# Patient Record
Sex: Female | Born: 1950
Health system: Southern US, Community
[De-identification: ages and names within clinical notes are randomized; demographics above are authoritative.]

## PROBLEM LIST (undated history)

## (undated) DIAGNOSIS — E119 Type 2 diabetes mellitus without complications: Secondary | ICD-10-CM

## (undated) DIAGNOSIS — E079 Disorder of thyroid, unspecified: Secondary | ICD-10-CM

## (undated) DIAGNOSIS — E785 Hyperlipidemia, unspecified: Secondary | ICD-10-CM

## (undated) DIAGNOSIS — I10 Essential (primary) hypertension: Secondary | ICD-10-CM

## (undated) DIAGNOSIS — C801 Malignant (primary) neoplasm, unspecified: Secondary | ICD-10-CM

## (undated) HISTORY — PX: WISDOM TOOTH EXTRACTION: SHX21

## (undated) HISTORY — PX: DILATION AND CURETTAGE OF UTERUS: SHX78

---

## 2006-08-02 ENCOUNTER — Ambulatory Visit: Payer: Self-pay

## 2007-10-03 ENCOUNTER — Ambulatory Visit: Payer: Self-pay

## 2008-10-03 ENCOUNTER — Ambulatory Visit: Payer: Self-pay | Admitting: Family Medicine

## 2009-10-06 ENCOUNTER — Ambulatory Visit: Payer: Self-pay

## 2010-10-08 ENCOUNTER — Ambulatory Visit: Payer: Self-pay

## 2011-12-27 ENCOUNTER — Ambulatory Visit: Payer: Self-pay | Admitting: Family Medicine

## 2013-01-09 ENCOUNTER — Ambulatory Visit: Payer: Self-pay | Admitting: Family Medicine

## 2014-02-12 DIAGNOSIS — I10 Essential (primary) hypertension: Secondary | ICD-10-CM | POA: Insufficient documentation

## 2014-02-12 DIAGNOSIS — E039 Hypothyroidism, unspecified: Secondary | ICD-10-CM | POA: Insufficient documentation

## 2014-02-12 DIAGNOSIS — I152 Hypertension secondary to endocrine disorders: Secondary | ICD-10-CM | POA: Insufficient documentation

## 2014-02-12 DIAGNOSIS — E1159 Type 2 diabetes mellitus with other circulatory complications: Secondary | ICD-10-CM | POA: Insufficient documentation

## 2014-03-10 ENCOUNTER — Ambulatory Visit: Payer: Self-pay | Admitting: Internal Medicine

## 2015-03-05 ENCOUNTER — Other Ambulatory Visit: Payer: Self-pay | Admitting: Internal Medicine

## 2015-03-05 DIAGNOSIS — Z1231 Encounter for screening mammogram for malignant neoplasm of breast: Secondary | ICD-10-CM

## 2015-03-12 ENCOUNTER — Ambulatory Visit
Admission: RE | Admit: 2015-03-12 | Discharge: 2015-03-12 | Disposition: A | Discharge status: Home / Self Care | Payer: No Typology Code available for payment source | Source: Ambulatory Visit | Attending: Internal Medicine | Admitting: Internal Medicine

## 2015-03-12 DIAGNOSIS — Z1231 Encounter for screening mammogram for malignant neoplasm of breast: Secondary | ICD-10-CM | POA: Insufficient documentation

## 2015-03-12 HISTORY — DX: Malignant (primary) neoplasm, unspecified: C80.1

## 2015-09-04 DIAGNOSIS — E119 Type 2 diabetes mellitus without complications: Secondary | ICD-10-CM | POA: Insufficient documentation

## 2015-12-04 ENCOUNTER — Other Ambulatory Visit: Payer: Self-pay | Admitting: Internal Medicine

## 2015-12-04 DIAGNOSIS — E782 Mixed hyperlipidemia: Secondary | ICD-10-CM | POA: Insufficient documentation

## 2015-12-04 DIAGNOSIS — E1169 Type 2 diabetes mellitus with other specified complication: Secondary | ICD-10-CM | POA: Insufficient documentation

## 2015-12-04 DIAGNOSIS — Z1239 Encounter for other screening for malignant neoplasm of breast: Secondary | ICD-10-CM

## 2016-03-14 ENCOUNTER — Other Ambulatory Visit: Payer: Self-pay | Admitting: Internal Medicine

## 2016-03-14 ENCOUNTER — Ambulatory Visit
Admission: RE | Admit: 2016-03-14 | Discharge: 2016-03-14 | Disposition: A | Discharge status: Home / Self Care | Payer: BLUE CROSS/BLUE SHIELD | Source: Ambulatory Visit | Attending: Internal Medicine | Admitting: Internal Medicine

## 2016-03-14 DIAGNOSIS — Z1239 Encounter for other screening for malignant neoplasm of breast: Secondary | ICD-10-CM

## 2016-03-14 DIAGNOSIS — Z1231 Encounter for screening mammogram for malignant neoplasm of breast: Secondary | ICD-10-CM | POA: Diagnosis not present

## 2016-10-27 DIAGNOSIS — E119 Type 2 diabetes mellitus without complications: Secondary | ICD-10-CM | POA: Diagnosis not present

## 2017-03-02 DIAGNOSIS — E119 Type 2 diabetes mellitus without complications: Secondary | ICD-10-CM | POA: Diagnosis not present

## 2017-03-02 DIAGNOSIS — Z0000 Encounter for general adult medical examination without abnormal findings: Secondary | ICD-10-CM | POA: Diagnosis not present

## 2017-03-02 DIAGNOSIS — I10 Essential (primary) hypertension: Secondary | ICD-10-CM | POA: Diagnosis not present

## 2017-03-02 DIAGNOSIS — E785 Hyperlipidemia, unspecified: Secondary | ICD-10-CM | POA: Diagnosis not present

## 2017-03-09 DIAGNOSIS — Z1231 Encounter for screening mammogram for malignant neoplasm of breast: Secondary | ICD-10-CM | POA: Diagnosis not present

## 2017-03-09 DIAGNOSIS — Z0000 Encounter for general adult medical examination without abnormal findings: Secondary | ICD-10-CM | POA: Diagnosis not present

## 2017-03-09 DIAGNOSIS — Z780 Asymptomatic menopausal state: Secondary | ICD-10-CM | POA: Diagnosis not present

## 2017-03-09 DIAGNOSIS — Z23 Encounter for immunization: Secondary | ICD-10-CM | POA: Diagnosis not present

## 2017-03-09 DIAGNOSIS — I10 Essential (primary) hypertension: Secondary | ICD-10-CM | POA: Diagnosis not present

## 2017-03-09 DIAGNOSIS — Z1211 Encounter for screening for malignant neoplasm of colon: Secondary | ICD-10-CM | POA: Diagnosis not present

## 2017-03-10 ENCOUNTER — Other Ambulatory Visit: Payer: Self-pay | Admitting: Internal Medicine

## 2017-03-10 DIAGNOSIS — Z1231 Encounter for screening mammogram for malignant neoplasm of breast: Secondary | ICD-10-CM

## 2017-03-15 DIAGNOSIS — Z0000 Encounter for general adult medical examination without abnormal findings: Secondary | ICD-10-CM | POA: Diagnosis not present

## 2017-03-15 DIAGNOSIS — I10 Essential (primary) hypertension: Secondary | ICD-10-CM | POA: Diagnosis not present

## 2017-03-15 DIAGNOSIS — Z1231 Encounter for screening mammogram for malignant neoplasm of breast: Secondary | ICD-10-CM | POA: Diagnosis not present

## 2017-03-15 DIAGNOSIS — Z1211 Encounter for screening for malignant neoplasm of colon: Secondary | ICD-10-CM | POA: Diagnosis not present

## 2017-03-15 DIAGNOSIS — Z780 Asymptomatic menopausal state: Secondary | ICD-10-CM | POA: Diagnosis not present

## 2017-03-15 DIAGNOSIS — Z23 Encounter for immunization: Secondary | ICD-10-CM | POA: Diagnosis not present

## 2017-03-23 ENCOUNTER — Ambulatory Visit
Admission: RE | Admit: 2017-03-23 | Discharge: 2017-03-23 | Disposition: A | Discharge status: Home / Self Care | Payer: PPO | Source: Ambulatory Visit | Attending: Internal Medicine | Admitting: Internal Medicine

## 2017-03-23 DIAGNOSIS — Z1231 Encounter for screening mammogram for malignant neoplasm of breast: Secondary | ICD-10-CM | POA: Diagnosis not present

## 2017-06-14 DIAGNOSIS — I10 Essential (primary) hypertension: Secondary | ICD-10-CM | POA: Diagnosis not present

## 2017-06-14 DIAGNOSIS — Z780 Asymptomatic menopausal state: Secondary | ICD-10-CM | POA: Diagnosis not present

## 2017-06-21 DIAGNOSIS — E782 Mixed hyperlipidemia: Secondary | ICD-10-CM | POA: Diagnosis not present

## 2017-06-21 DIAGNOSIS — Z780 Asymptomatic menopausal state: Secondary | ICD-10-CM | POA: Diagnosis not present

## 2017-06-21 DIAGNOSIS — I10 Essential (primary) hypertension: Secondary | ICD-10-CM | POA: Diagnosis not present

## 2017-06-21 DIAGNOSIS — E119 Type 2 diabetes mellitus without complications: Secondary | ICD-10-CM | POA: Diagnosis not present

## 2017-06-21 DIAGNOSIS — E039 Hypothyroidism, unspecified: Secondary | ICD-10-CM | POA: Diagnosis not present

## 2018-03-05 DIAGNOSIS — E119 Type 2 diabetes mellitus without complications: Secondary | ICD-10-CM | POA: Diagnosis not present

## 2018-03-05 DIAGNOSIS — I10 Essential (primary) hypertension: Secondary | ICD-10-CM | POA: Diagnosis not present

## 2018-03-05 DIAGNOSIS — E039 Hypothyroidism, unspecified: Secondary | ICD-10-CM | POA: Diagnosis not present

## 2018-03-05 DIAGNOSIS — E782 Mixed hyperlipidemia: Secondary | ICD-10-CM | POA: Diagnosis not present

## 2018-03-12 DIAGNOSIS — Z23 Encounter for immunization: Secondary | ICD-10-CM | POA: Diagnosis not present

## 2018-03-12 DIAGNOSIS — E119 Type 2 diabetes mellitus without complications: Secondary | ICD-10-CM | POA: Diagnosis not present

## 2018-03-12 DIAGNOSIS — Z0000 Encounter for general adult medical examination without abnormal findings: Secondary | ICD-10-CM | POA: Diagnosis not present

## 2018-03-12 DIAGNOSIS — E039 Hypothyroidism, unspecified: Secondary | ICD-10-CM | POA: Diagnosis not present

## 2018-03-26 DIAGNOSIS — Z800 Family history of malignant neoplasm of digestive organs: Secondary | ICD-10-CM | POA: Diagnosis not present

## 2018-03-26 DIAGNOSIS — Z1211 Encounter for screening for malignant neoplasm of colon: Secondary | ICD-10-CM | POA: Diagnosis not present

## 2018-03-26 DIAGNOSIS — Z8371 Family history of colonic polyps: Secondary | ICD-10-CM | POA: Diagnosis not present

## 2018-03-27 ENCOUNTER — Other Ambulatory Visit: Payer: Self-pay | Admitting: Internal Medicine

## 2018-03-27 DIAGNOSIS — Z1231 Encounter for screening mammogram for malignant neoplasm of breast: Secondary | ICD-10-CM

## 2018-04-05 ENCOUNTER — Ambulatory Visit
Admission: RE | Admit: 2018-04-05 | Discharge: 2018-04-05 | Disposition: A | Discharge status: Home / Self Care | Payer: PPO | Source: Ambulatory Visit | Attending: Internal Medicine | Admitting: Internal Medicine

## 2018-04-05 DIAGNOSIS — Z1231 Encounter for screening mammogram for malignant neoplasm of breast: Secondary | ICD-10-CM | POA: Insufficient documentation

## 2018-05-29 ENCOUNTER — Encounter: Payer: Self-pay | Admitting: *Deleted

## 2018-05-30 ENCOUNTER — Ambulatory Visit
Admission: RE | Admit: 2018-05-30 | Discharge: 2018-05-30 | Disposition: A | Discharge status: Home / Self Care | Payer: PPO | Source: Ambulatory Visit | Attending: Unknown Physician Specialty | Admitting: Unknown Physician Specialty

## 2018-05-30 ENCOUNTER — Ambulatory Visit: Payer: PPO | Admitting: Certified Registered Nurse Anesthetist

## 2018-05-30 ENCOUNTER — Encounter
Admission: RE | Disposition: A | Discharge status: Home / Self Care | Payer: Self-pay | Source: Ambulatory Visit | Attending: Unknown Physician Specialty

## 2018-05-30 ENCOUNTER — Encounter: Payer: Self-pay | Admitting: Anesthesiology

## 2018-05-30 DIAGNOSIS — E039 Hypothyroidism, unspecified: Secondary | ICD-10-CM | POA: Diagnosis not present

## 2018-05-30 DIAGNOSIS — K5730 Diverticulosis of large intestine without perforation or abscess without bleeding: Secondary | ICD-10-CM | POA: Diagnosis not present

## 2018-05-30 DIAGNOSIS — E119 Type 2 diabetes mellitus without complications: Secondary | ICD-10-CM | POA: Diagnosis not present

## 2018-05-30 DIAGNOSIS — K640 First degree hemorrhoids: Secondary | ICD-10-CM | POA: Diagnosis not present

## 2018-05-30 DIAGNOSIS — Z7984 Long term (current) use of oral hypoglycemic drugs: Secondary | ICD-10-CM | POA: Insufficient documentation

## 2018-05-30 DIAGNOSIS — Z800 Family history of malignant neoplasm of digestive organs: Secondary | ICD-10-CM | POA: Insufficient documentation

## 2018-05-30 DIAGNOSIS — Z79899 Other long term (current) drug therapy: Secondary | ICD-10-CM | POA: Insufficient documentation

## 2018-05-30 DIAGNOSIS — Z1211 Encounter for screening for malignant neoplasm of colon: Secondary | ICD-10-CM | POA: Diagnosis not present

## 2018-05-30 DIAGNOSIS — E785 Hyperlipidemia, unspecified: Secondary | ICD-10-CM | POA: Diagnosis not present

## 2018-05-30 DIAGNOSIS — Z85828 Personal history of other malignant neoplasm of skin: Secondary | ICD-10-CM | POA: Diagnosis not present

## 2018-05-30 DIAGNOSIS — I10 Essential (primary) hypertension: Secondary | ICD-10-CM | POA: Insufficient documentation

## 2018-05-30 DIAGNOSIS — Z79890 Hormone replacement therapy (postmenopausal): Secondary | ICD-10-CM | POA: Diagnosis not present

## 2018-05-30 DIAGNOSIS — K648 Other hemorrhoids: Secondary | ICD-10-CM | POA: Diagnosis not present

## 2018-05-30 DIAGNOSIS — K5790 Diverticulosis of intestine, part unspecified, without perforation or abscess without bleeding: Secondary | ICD-10-CM | POA: Diagnosis not present

## 2018-05-30 HISTORY — DX: Hyperlipidemia, unspecified: E78.5

## 2018-05-30 HISTORY — DX: Type 2 diabetes mellitus without complications: E11.9

## 2018-05-30 HISTORY — PX: COLONOSCOPY WITH PROPOFOL: SHX5780

## 2018-05-30 HISTORY — DX: Disorder of thyroid, unspecified: E07.9

## 2018-05-30 HISTORY — DX: Essential (primary) hypertension: I10

## 2018-05-30 LAB — GLUCOSE, CAPILLARY: GLUCOSE-CAPILLARY: 85 mg/dL (ref 70–99)

## 2018-05-30 SURGERY — COLONOSCOPY WITH PROPOFOL
Anesthesia: General

## 2018-05-30 MED ORDER — GLYCOPYRROLATE 0.2 MG/ML IJ SOLN
INTRAMUSCULAR | Status: DC | PRN
  Administered 2018-05-30: 0.2 mg via INTRAVENOUS

## 2018-05-30 MED ORDER — SODIUM CHLORIDE 0.9 % IV SOLN
INTRAVENOUS | Status: DC
  Administered 2018-05-30: 10000 mL via INTRAVENOUS

## 2018-05-30 MED ORDER — MIDAZOLAM HCL 5 MG/5ML IJ SOLN
INTRAMUSCULAR | Status: DC | PRN
  Administered 2018-05-30: 1 mg via INTRAVENOUS

## 2018-05-30 MED ORDER — LIDOCAINE HCL (PF) 1 % IJ SOLN
INTRAMUSCULAR | Status: AC
  Administered 2018-05-30: 0.3 mL via INTRADERMAL
  Filled 2018-05-30: qty 2

## 2018-05-30 MED ORDER — PROPOFOL 10 MG/ML IV BOLUS
INTRAVENOUS | Status: DC | PRN
  Administered 2018-05-30: 80 mg via INTRAVENOUS

## 2018-05-30 MED ORDER — SODIUM CHLORIDE 0.9 % IV SOLN: INTRAVENOUS | Status: DC

## 2018-05-30 MED ORDER — FENTANYL CITRATE (PF) 100 MCG/2ML IJ SOLN
INTRAMUSCULAR | Status: DC | PRN
  Administered 2018-05-30: 50 ug via INTRAVENOUS

## 2018-05-30 MED ORDER — LIDOCAINE HCL (CARDIAC) PF 100 MG/5ML IV SOSY
PREFILLED_SYRINGE | INTRAVENOUS | Status: DC | PRN
  Administered 2018-05-30: 100 mg via INTRAVENOUS

## 2018-05-30 MED ORDER — PROPOFOL 500 MG/50ML IV EMUL
INTRAVENOUS | Status: DC | PRN
  Administered 2018-05-30: 100 ug/kg/min via INTRAVENOUS

## 2018-05-30 MED ORDER — LIDOCAINE HCL (PF) 1 % IJ SOLN
2.0000 mL | Freq: Once | INTRAMUSCULAR | Status: AC
  Administered 2018-05-30: 0.3 mL via INTRADERMAL

## 2018-05-30 NOTE — Anesthesia Post-op Follow-up Note (Signed)
Anesthesia QCDR form completed.        

## 2018-05-30 NOTE — Op Note (Signed)
[facility] Patient Name: Andrea Browning Procedure Date: 05/30/2018 8:21 AM MRN: [id number] Account #: [id number] Date of Birth: Dec 13, 1950 Admit Type: Outpatient Age: 67 Room: [facility] ENDO ROOM 2 Gender: Female Note Status: Finalized Procedure:            Colonoscopy Indications:          Screening in patient at increased risk: Family history                        of 1st-degree relative with colorectal cancer before                        age 43 years Providers:            Manya Silvas, MD Referring MD:         Glendon Axe (Referring MD) Medicines:            Propofol per Anesthesia Complications:        No immediate complications. Procedure:            Pre-Anesthesia Assessment:                       - After reviewing the risks and benefits, the patient                        was deemed in satisfactory condition to undergo the                        procedure.                       After obtaining informed consent, the colonoscope was                        passed under direct vision. Throughout the procedure,                        the patient's blood pressure, pulse, and oxygen                        saturations were monitored continuously. The                        Colonoscope was introduced through the anus and                        advanced to the the cecum, identified by appendiceal                        orifice and ileocecal valve. The colonoscopy was                        somewhat difficult due to poor bowel prep. Successful                        completion of the procedure was aided by lavage. The                        patient tolerated the procedure well. Findings:      Multiple small-medium-mouthed diverticula were found in the sigmoid  colon, descending colon, transverse colon and ascending colon.      Internal hemorrhoids were found during endoscopy. The hemorrhoids were       small and Grade I (internal hemorrhoids  that do not prolapse).      The exam was otherwise without abnormality. Impression:           - Diverticulosis in the sigmoid colon, in the                        descending colon, in the transverse colon and in the                        ascending colon.                       - Internal hemorrhoids.                       - The examination was otherwise normal.                       - No specimens collected. Recommendation:       Repeat colon exam in 5 years.                       - Repeat colonoscopy in 5 years for screening purposes. Manya Silvas, MD 05/30/2018 9:08:02 AM This report has been signed electronically. Number of Addenda: 0 Note Initiated On: 05/30/2018 8:21 AM Scope Withdrawal Time: 0 hours 12 minutes 46 seconds  Total Procedure Duration: 0 hours 23 minutes 44 seconds       [facility]

## 2018-05-30 NOTE — Anesthesia Postprocedure Evaluation (Signed)
Anesthesia Post Note  Patient: Andrea Browning  Procedure(s) Performed: COLONOSCOPY WITH PROPOFOL (N/A )  Patient location during evaluation: Endoscopy Anesthesia Type: General Level of consciousness: awake and alert Pain management: pain level controlled Vital Signs Assessment: post-procedure vital signs reviewed and stable Respiratory status: spontaneous breathing, nonlabored ventilation, respiratory function stable and patient connected to nasal cannula oxygen Cardiovascular status: blood pressure returned to baseline and stable Postop Assessment: no apparent nausea or vomiting Anesthetic complications: no     Last Vitals:  Vitals:   05/30/18 0900 05/30/18 0910  BP: (!) 107/55 112/68  Pulse: (!) 57 63  Resp: 12 12  Temp: (!) 36 C   SpO2: 100% 99%    Last Pain:  Vitals:   05/30/18 0900  TempSrc: Tympanic                 Martha Clan

## 2018-05-30 NOTE — Anesthesia Preprocedure Evaluation (Signed)
Anesthesia Evaluation  Patient identified by MRN, date of birth, ID band Patient awake    Reviewed: Allergy & Precautions, H&P , NPO status , Patient's Chart, lab work & pertinent test results, reviewed documented beta blocker date and time   History of Anesthesia Complications Negative for: history of anesthetic complications  Airway Mallampati: I  TM Distance: >3 FB Neck ROM: full    Dental  (+) Dental Advidsory Given, Missing, Teeth Intact   Pulmonary neg pulmonary ROS,           Cardiovascular Exercise Tolerance: Good hypertension, (-) angina(-) CAD, (-) Past MI, (-) Cardiac Stents and (-) CABG (-) dysrhythmias (-) Valvular Problems/Murmurs     Neuro/Psych negative neurological ROS  negative psych ROS   GI/Hepatic negative GI ROS, Neg liver ROS,   Endo/Other  diabetesHypothyroidism   Renal/GU negative Renal ROS  negative genitourinary   Musculoskeletal   Abdominal   Peds  Hematology negative hematology ROS (+)   Anesthesia Other Findings Past Medical History: No date: Cancer ([place])     Comment:  Skin No date: Diabetes mellitus without complication (HCC) No date: Hyperlipidemia No date: Hypertension No date: Thyroid disease   Reproductive/Obstetrics negative OB ROS                             Anesthesia Physical Anesthesia Plan  ASA: II  Anesthesia Plan: General   Post-op Pain Management:    Induction: Intravenous  PONV Risk Score and Plan: 3 and Propofol infusion and TIVA  Airway Management Planned: Natural Airway and Nasal Cannula  Additional Equipment:   Intra-op Plan:   Post-operative Plan:   Informed Consent: I have reviewed the patients History and Physical, chart, labs and discussed the procedure including the risks, benefits and alternatives for the proposed anesthesia with the patient or authorized representative who has indicated his/her understanding and  acceptance.   Dental Advisory Given  Plan Discussed with: Anesthesiologist, CRNA and Surgeon  Anesthesia Plan Comments:         Anesthesia Quick Evaluation

## 2018-05-30 NOTE — Transfer of Care (Signed)
Immediate Anesthesia Transfer of Care Note  Patient: Andrea Browning  Procedure(s) Performed: COLONOSCOPY WITH PROPOFOL (N/A )  Patient Location: PACU  Anesthesia Type:General  Level of Consciousness: sedated  Airway & Oxygen Therapy: Patient Spontanous Breathing and Patient connected to nasal cannula oxygen  Post-op Assessment: Report given to RN and Post -op Vital signs reviewed and stable  Post vital signs: Reviewed and stable  Last Vitals:  Vitals Value Taken Time  BP    Temp    Pulse    Resp    SpO2      Last Pain:  Vitals:   05/30/18 0756  TempSrc: Tympanic         Complications: No apparent anesthesia complications

## 2018-05-30 NOTE — H&P (Signed)
Primary Care Physician:  Glendon Axe, MD Primary Gastroenterologist:  Dr. Vira Agar  Pre-Procedure History & Physical: HPI:  Andrea Browning is a 67 y.o. female is here for an colonoscopy.  This is her first colonoscopy, her mother died of colon cancer at age 88.   Past Medical History:  Diagnosis Date  . Cancer (HCC)    Skin  . Diabetes mellitus without complication ([place])   . Hyperlipidemia   . Hypertension   . Thyroid disease     No past surgical history on file.  Prior to Admission medications   Medication Sig Start Date End Date Taking? Authorizing Provider  levothyroxine (SYNTHROID, LEVOTHROID) 88 MCG tablet Take 88 mcg by mouth daily before breakfast.   Yes [provider]  lisinopril (PRINIVIL,ZESTRIL) 10 MG tablet Take 10 mg by mouth daily.   Yes [provider]  lovastatin (MEVACOR) 40 MG tablet Take 40 mg by mouth at bedtime.   Yes [provider]  metFORMIN (GLUCOPHAGE) 500 MG tablet Take 500 mg by mouth daily after supper.   Yes [provider]    Allergies as of 04/02/2018  . (Not on File)    Family History  Problem Relation Age of Onset  . Breast cancer Maternal Aunt 60  . Breast cancer Sister 84    Social History   Socioeconomic History  . Marital status: Married    Spouse name: Not on file  . Number of children: Not on file  . Years of education: Not on file  . Highest education level: Not on file  Occupational History  . Not on file  Social Needs  . Financial resource strain: Not on file  . Food insecurity:    Worry: Not on file    Inability: Not on file  . Transportation needs:    Medical: Not on file    Non-medical: Not on file  Tobacco Use  . Smoking status: Not on file  Substance and Sexual Activity  . Alcohol use: Not on file  . Drug use: Not on file  . Sexual activity: Not on file  Lifestyle  . Physical activity:    Days per week: Not on file    Minutes per session: Not on file  .  Stress: Not on file  Relationships  . Social connections:    Talks on phone: Not on file    Gets together: Not on file    Attends religious service: Not on file    Active member of club or organization: Not on file    Attends meetings of clubs or organizations: Not on file    Relationship status: Not on file  . Intimate partner violence:    Fear of current or ex partner: Not on file    Emotionally abused: Not on file    Physically abused: Not on file    Forced sexual activity: Not on file  Other Topics Concern  . Not on file  Social History Narrative  . Not on file    Review of Systems: See HPI, otherwise negative ROS  Physical Exam: BP 130/73   Pulse (!) 57   Temp (!) 97.3 F (36.3 C) (Tympanic)   Resp 17   Ht 5\' 6"  (1.676 m)   Wt 83.9 kg   SpO2 100%   BMI 29.86 kg/m  General:   Alert,  pleasant and cooperative in NAD Head:  Normocephalic and atraumatic. Neck:  Supple; no masses or thyromegaly. Lungs:  Clear throughout to auscultation.  Heart:  Regular rate and rhythm. Abdomen:  Soft, nontender and nondistended. Normal bowel sounds, without guarding, and without rebound.   Neurologic:  Alert and  oriented x4;  grossly normal neurologically.  Impression/Plan: Andrea Browning is here for an colonoscopy to be performed for FH colon cancer in her mother.  Risks, benefits, limitations, and alternatives regarding  colonoscopy have been reviewed with the patient.  Questions have been answered.  All parties agreeable.   Gaylyn Cheers, MD  05/30/2018, 8:19 AM

## 2018-05-31 ENCOUNTER — Encounter: Payer: Self-pay | Admitting: Unknown Physician Specialty

## 2018-07-31 DIAGNOSIS — E039 Hypothyroidism, unspecified: Secondary | ICD-10-CM | POA: Diagnosis not present

## 2018-07-31 DIAGNOSIS — I10 Essential (primary) hypertension: Secondary | ICD-10-CM | POA: Diagnosis not present

## 2018-08-28 DIAGNOSIS — E039 Hypothyroidism, unspecified: Secondary | ICD-10-CM | POA: Diagnosis not present

## 2018-08-28 DIAGNOSIS — E119 Type 2 diabetes mellitus without complications: Secondary | ICD-10-CM | POA: Diagnosis not present

## 2018-09-04 DIAGNOSIS — I10 Essential (primary) hypertension: Secondary | ICD-10-CM | POA: Diagnosis not present

## 2018-09-04 DIAGNOSIS — E119 Type 2 diabetes mellitus without complications: Secondary | ICD-10-CM | POA: Diagnosis not present

## 2019-02-18 ENCOUNTER — Ambulatory Visit (INDEPENDENT_AMBULATORY_CARE_PROVIDER_SITE_OTHER): Payer: PPO | Admitting: Family Medicine

## 2019-02-18 ENCOUNTER — Encounter: Payer: Self-pay | Admitting: Family Medicine

## 2019-02-18 ENCOUNTER — Other Ambulatory Visit: Payer: Self-pay

## 2019-02-18 VITALS — BP 118/68 | HR 62 | Temp 98.3°F | Ht 66.0 in | Wt 188.0 lb

## 2019-02-18 DIAGNOSIS — E119 Type 2 diabetes mellitus without complications: Secondary | ICD-10-CM

## 2019-02-18 DIAGNOSIS — Z7689 Persons encountering health services in other specified circumstances: Secondary | ICD-10-CM

## 2019-02-18 DIAGNOSIS — E039 Hypothyroidism, unspecified: Secondary | ICD-10-CM | POA: Diagnosis not present

## 2019-02-18 DIAGNOSIS — E782 Mixed hyperlipidemia: Secondary | ICD-10-CM | POA: Diagnosis not present

## 2019-02-18 LAB — COMPREHENSIVE METABOLIC PANEL
ALT: 13 U/L (ref 0–35)
AST: 12 U/L (ref 0–37)
Albumin: 4.5 g/dL (ref 3.5–5.2)
Alkaline Phosphatase: 55 U/L (ref 39–117)
BUN: 27 mg/dL — ABNORMAL HIGH (ref 6–23)
CO2: 30 mEq/L (ref 19–32)
Calcium: 9.8 mg/dL (ref 8.4–10.5)
Chloride: 102 mEq/L (ref 96–112)
Creatinine, Ser: 0.92 mg/dL (ref 0.40–1.20)
GFR: 60.74 mL/min (ref >60.00)
Glucose, Bld: 104 mg/dL — ABNORMAL HIGH (ref 70–99)
Potassium: 4.7 mEq/L (ref 3.5–5.1)
Sodium: 142 mEq/L (ref 135–145)
Total Bilirubin: 0.4 mg/dL (ref 0.2–1.2)
Total Protein: 7.1 g/dL (ref 6.0–8.3)

## 2019-02-18 LAB — CBC WITH DIFFERENTIAL/PLATELET
Basophils Absolute: 0.1 10*3/uL (ref 0.0–0.1)
Basophils Relative: 0.9 % (ref 0.0–3.0)
Eosinophils Absolute: 0.1 10*3/uL (ref 0.0–0.7)
Eosinophils Relative: 1.9 % (ref 0.0–5.0)
HCT: 41 % (ref 36.0–46.0)
Hemoglobin: 13.5 g/dL (ref 12.0–15.0)
Lymphocytes Relative: 29.6 % (ref 12.0–46.0)
Lymphs Abs: 1.9 10*3/uL (ref 0.7–4.0)
MCHC: 32.9 g/dL (ref 30.0–36.0)
MCV: 89.6 fl (ref 78.0–100.0)
Monocytes Absolute: 0.5 10*3/uL (ref 0.1–1.0)
Monocytes Relative: 7.7 % (ref 3.0–12.0)
Neutro Abs: 3.8 10*3/uL (ref 1.4–7.7)
Neutrophils Relative %: 59.9 % (ref 43.0–77.0)
Platelets: 244 10*3/uL (ref 150.0–400.0)
RBC: 4.58 Mil/uL (ref 3.87–5.11)
RDW: 14.1 % (ref 11.5–15.5)
WBC: 6.3 10*3/uL (ref 4.0–10.5)

## 2019-02-18 LAB — LIPID PANEL
Cholesterol: 203 mg/dL — ABNORMAL HIGH (ref 0–200)
HDL: 41.1 mg/dL (ref >39.00)
LDL Cholesterol: 131 mg/dL — ABNORMAL HIGH (ref 0–99)
NonHDL: 162.16
Total CHOL/HDL Ratio: 5
Triglycerides: 157 mg/dL — ABNORMAL HIGH (ref 0.0–149.0)
VLDL: 31.4 mg/dL (ref 0.0–40.0)

## 2019-02-18 LAB — HEMOGLOBIN A1C: Hgb A1c MFr Bld: 6.8 % — ABNORMAL HIGH (ref 4.6–6.5)

## 2019-02-18 LAB — TSH: TSH: 0.97 u[IU]/mL (ref 0.35–4.50)

## 2019-02-18 NOTE — Patient Instructions (Addendum)
Good to see you today  Please schedule your annual exam for 6 months  Let me know when you need refills of medication- either call or send me a mychart message

## 2019-02-18 NOTE — Progress Notes (Signed)
   Subjective:    Patient ID: Andrea Browning, female    DOB: October 28, 1950, 68 y.o.   MRN: [id number]  HPI This is a 68 yo female who presents today to establish care. She lives with her husband. Loves to garden. She has no concerns or complaints today.   Last CPE- last year Mammo- 8/19 Pap- 2013, not sexually active Colonoscopy- 2019 Tdap- 08/23/2008 Flu-annual Eye-annual Dental- regular Exercise- gardens  DM- checks her blood sugar at home, usually runs 110-130.   ROS- no chest pain or shortness of breath, no muscle or joint pain, no abdominal pain, diarrhea or constipation.    Past Medical History:  Diagnosis Date  . Cancer (HCC)    Skin  . Diabetes mellitus without complication ([place])   . Hyperlipidemia   . Hypertension   . Thyroid disease    Past Surgical History:  Procedure Laterality Date  . COLONOSCOPY WITH PROPOFOL N/A 05/30/2018   Procedure: COLONOSCOPY WITH PROPOFOL;  Surgeon: Manya Silvas, MD;  Location: [facility] ENDOSCOPY;  Service: Endoscopy;  Laterality: N/A;  . DILATION AND CURETTAGE OF UTERUS    . WISDOM TOOTH EXTRACTION     Family History  Problem Relation Age of Onset  . Breast cancer Maternal Aunt 60  . Breast cancer Sister 68  . Colon cancer Mother    Social History   Tobacco Use  . Smoking status: Never Smoker  . Smokeless tobacco: Never Used  Substance Use Topics  . Alcohol use: Never    Frequency: Never  . Drug use: Never      Review of Systems Per HPI    Objective:   Physical Exam Physical Exam  Constitutional: Oriented to person, place, and time. He appears well-developed and well-nourished.  HENT:  Head: Normocephalic and atraumatic.  Eyes: Conjunctivae are normal.  Neck: Normal range of motion. Neck supple.  Cardiovascular: Normal rate, regular rhythm and normal heart sounds.   Pulmonary/Chest: Effort normal and breath sounds normal.  Musculoskeletal: Normal range of motion.  Neurological: Alert and oriented to person,  place, and time.  Skin: Skin is warm and dry.  Psychiatric: Normal mood and affect. Behavior is normal. Judgment and thought content normal.  Vitals reviewed.     BP 118/68   Pulse 62   Temp 98.3 F (36.8 C) (Oral)   Ht 5\' 6"  (1.676 m)   Wt 188 lb (85.3 kg)   SpO2 98%   BMI 30.34 kg/m  Wt Readings from Last 3 Encounters:  02/18/19 188 lb (85.3 kg)  05/30/18 185 lb (83.9 kg)       Assessment & Plan:  1. Encounter to establish care - EMR records reviewed - follow up in 6 months for CPE with pap  2. Controlled type 2 diabetes mellitus without complication, without long-term current use of insulin (HCC) - Comprehensive metabolic panel - Hemoglobin A1c - Lipid Panel - CBC with Differential - TSH  3. Hypothyroidism (acquired) - Lipid Panel - TSH   Clarene Reamer, FNP-BC  [facility]  02/18/2019 10:17 AM

## 2019-02-19 MED ORDER — ROSUVASTATIN CALCIUM 20 MG PO TABS: 20.0000 mg | ORAL_TABLET | Freq: Every day | ORAL | 3 refills | Status: DC

## 2019-02-19 NOTE — Addendum Note (Signed)
Addended by: Clarene Reamer B on: 02/19/2019 04:04 PM   Modules accepted: Orders

## 2019-03-06 ENCOUNTER — Other Ambulatory Visit: Payer: Self-pay

## 2019-03-06 MED ORDER — LISINOPRIL 10 MG PO TABS: 10.0000 mg | ORAL_TABLET | Freq: Every day | ORAL | 3 refills | Status: DC

## 2019-03-06 NOTE — Telephone Encounter (Signed)
Pt is new to [place] and was told to call when she needed a refill of her medications. She needs a refill of Lisinopril 90 days sent to [facility] [address] [address]

## 2019-03-28 ENCOUNTER — Other Ambulatory Visit: Payer: Self-pay | Admitting: Family Medicine

## 2019-03-28 DIAGNOSIS — Z1231 Encounter for screening mammogram for malignant neoplasm of breast: Secondary | ICD-10-CM

## 2019-04-16 ENCOUNTER — Telehealth: Payer: Self-pay

## 2019-04-16 NOTE — Telephone Encounter (Signed)
Pt left v/m that pts metformin has been recalled. Pt request cb with what to do. [facility] established care on 02/18/19.

## 2019-04-17 NOTE — Telephone Encounter (Signed)
Please call the patient and tell her that some of the other pharmacies locally have the extended release metformin that has not been recalled. I recommend that she call around to other pharmacies in her area and see if they have an alternative in stock. She can let me know which pharmacy she wishes to use and I will send in a new prescription.

## 2019-04-18 NOTE — Telephone Encounter (Signed)
Pt aware of recommendations per Jackelyn Poling, she is going to contact her pharmacy first to see if her specific Rx has been recalled and if not she will continue taking it. She states that she does not know if her Rx is the form that was recalled she just assumed -- pt will call back if hers is affected and will also check around for a pharmacy that is carrying an extended release Metformin not affected by the recall.

## 2019-05-06 ENCOUNTER — Ambulatory Visit
Admission: RE | Admit: 2019-05-06 | Discharge: 2019-05-06 | Disposition: A | Discharge status: Home / Self Care | Payer: PPO | Source: Ambulatory Visit | Attending: Family Medicine | Admitting: Family Medicine

## 2019-05-06 DIAGNOSIS — Z1231 Encounter for screening mammogram for malignant neoplasm of breast: Secondary | ICD-10-CM | POA: Insufficient documentation

## 2019-07-10 ENCOUNTER — Other Ambulatory Visit: Payer: Self-pay

## 2019-07-17 ENCOUNTER — Other Ambulatory Visit: Payer: Self-pay | Admitting: Family Medicine

## 2019-07-17 DIAGNOSIS — E119 Type 2 diabetes mellitus without complications: Secondary | ICD-10-CM

## 2019-08-20 ENCOUNTER — Ambulatory Visit: Payer: PPO

## 2019-08-24 DIAGNOSIS — Z20822 Contact with and (suspected) exposure to covid-19: Secondary | ICD-10-CM | POA: Diagnosis not present

## 2019-08-24 DIAGNOSIS — Z20828 Contact with and (suspected) exposure to other viral communicable diseases: Secondary | ICD-10-CM | POA: Diagnosis not present

## 2019-08-28 ENCOUNTER — Encounter: Payer: PPO | Admitting: Family Medicine

## 2019-09-02 ENCOUNTER — Other Ambulatory Visit: Payer: Self-pay | Admitting: Family Medicine

## 2019-09-02 DIAGNOSIS — Z1159 Encounter for screening for other viral diseases: Secondary | ICD-10-CM

## 2019-09-02 DIAGNOSIS — E2839 Other primary ovarian failure: Secondary | ICD-10-CM

## 2019-09-02 DIAGNOSIS — E039 Hypothyroidism, unspecified: Secondary | ICD-10-CM

## 2019-09-02 DIAGNOSIS — I10 Essential (primary) hypertension: Secondary | ICD-10-CM

## 2019-09-02 DIAGNOSIS — E782 Mixed hyperlipidemia: Secondary | ICD-10-CM

## 2019-09-02 DIAGNOSIS — E119 Type 2 diabetes mellitus without complications: Secondary | ICD-10-CM

## 2019-09-04 ENCOUNTER — Telehealth: Payer: Self-pay

## 2019-09-04 NOTE — Telephone Encounter (Signed)
LVM w COVID screen, front door and back lab info TLJ 1.20.2021

## 2019-09-06 ENCOUNTER — Other Ambulatory Visit (INDEPENDENT_AMBULATORY_CARE_PROVIDER_SITE_OTHER): Payer: PPO

## 2019-09-06 ENCOUNTER — Ambulatory Visit (INDEPENDENT_AMBULATORY_CARE_PROVIDER_SITE_OTHER): Payer: PPO

## 2019-09-06 ENCOUNTER — Other Ambulatory Visit: Payer: Self-pay

## 2019-09-06 DIAGNOSIS — E782 Mixed hyperlipidemia: Secondary | ICD-10-CM | POA: Diagnosis not present

## 2019-09-06 DIAGNOSIS — E2839 Other primary ovarian failure: Secondary | ICD-10-CM | POA: Diagnosis not present

## 2019-09-06 DIAGNOSIS — Z0000 Encounter for general adult medical examination without abnormal findings: Secondary | ICD-10-CM

## 2019-09-06 DIAGNOSIS — Z1159 Encounter for screening for other viral diseases: Secondary | ICD-10-CM | POA: Diagnosis not present

## 2019-09-06 DIAGNOSIS — E119 Type 2 diabetes mellitus without complications: Secondary | ICD-10-CM

## 2019-09-06 LAB — MICROALBUMIN / CREATININE URINE RATIO
Creatinine,U: 72.9 mg/dL
Microalb Creat Ratio: 5.1 mg/g (ref 0.0–30.0)
Microalb, Ur: 3.7 mg/dL — ABNORMAL HIGH (ref 0.0–1.9)

## 2019-09-06 LAB — LIPID PANEL
Cholesterol: 144 mg/dL (ref 0–200)
HDL: 31.9 mg/dL — ABNORMAL LOW (ref >39.00)
LDL Cholesterol: 87 mg/dL (ref 0–99)
NonHDL: 112.36
Total CHOL/HDL Ratio: 5
Triglycerides: 125 mg/dL (ref 0.0–149.0)
VLDL: 25 mg/dL (ref 0.0–40.0)

## 2019-09-06 LAB — VITAMIN B12: Vitamin B-12: 251 pg/mL (ref 211–911)

## 2019-09-06 LAB — VITAMIN D 25 HYDROXY (VIT D DEFICIENCY, FRACTURES): VITD: 55.51 ng/mL (ref 30.00–100.00)

## 2019-09-06 LAB — HEMOGLOBIN A1C: Hgb A1c MFr Bld: 7.2 % — ABNORMAL HIGH (ref 4.6–6.5)

## 2019-09-06 NOTE — Patient Instructions (Signed)
[name] , Thank you for taking time to come for your Medicare Wellness Visit. I appreciate your ongoing commitment to your health goals. Please review the following plan we discussed and let me know if I can assist you in the future.   Screening recommendations/referrals: Colonoscopy: Up to date, completed 05/30/2018 Mammogram: Up to date, completed 05/06/2019 Bone Density: declined, will have completed at later date Recommended yearly ophthalmology/optometry visit for glaucoma screening and checkup Recommended yearly dental visit for hygiene and checkup  Vaccinations: Influenza vaccine: Up to date, completed 05/13/2019 Pneumococcal vaccine: Completed series Tdap vaccine: decline Shingles vaccine: discussed    Advanced directives: Advance directive discussed with you today. Even though you declined this today please call our office should you change your mind and we can give you the proper paperwork for you to fill out.  Conditions/risks identified: diabetes, hypertension, hyperlipidemia  Next appointment: 09/13/2019 @ 10:30 am    Preventive Care 65 Years and Older, Female Preventive care refers to lifestyle choices and visits with your health care provider that can promote health and wellness. What does preventive care include?  A yearly physical exam. This is also called an annual well check.  Dental exams once or twice a year.  Routine eye exams. Ask your health care provider how often you should have your eyes checked.  Personal lifestyle choices, including:  Daily care of your teeth and gums.  Regular physical activity.  Eating a healthy diet.  Avoiding tobacco and drug use.  Limiting alcohol use.  Practicing safe sex.  Taking low-dose aspirin every day.  Taking vitamin and mineral supplements as recommended by your health care provider. What happens during an annual well check? The services and screenings done by your health care provider during your annual  well check will depend on your age, overall health, lifestyle risk factors, and family history of disease. Counseling  Your health care provider may ask you questions about your:  Alcohol use.  Tobacco use.  Drug use.  Emotional well-being.  Home and relationship well-being.  Sexual activity.  Eating habits.  History of falls.  Memory and ability to understand (cognition).  Work and work Statistician.  Reproductive health. Screening  You may have the following tests or measurements:  Height, weight, and BMI.  Blood pressure.  Lipid and cholesterol levels. These may be checked every 5 years, or more frequently if you are over 80 years old.  Skin check.  Lung cancer screening. You may have this screening every year starting at age 16 if you have a 30-pack-year history of smoking and currently smoke or have quit within the past 15 years.  Fecal occult blood test (FOBT) of the stool. You may have this test every year starting at age 40.  Flexible sigmoidoscopy or colonoscopy. You may have a sigmoidoscopy every 5 years or a colonoscopy every 10 years starting at age 59.  Hepatitis C blood test.  Hepatitis B blood test.  Sexually transmitted disease (STD) testing.  Diabetes screening. This is done by checking your blood sugar (glucose) after you have not eaten for a while (fasting). You may have this done every 1-3 years.  Bone density scan. This is done to screen for osteoporosis. You may have this done starting at age 1.  Mammogram. This may be done every 1-2 years. Talk to your health care provider about how often you should have regular mammograms. Talk with your health care provider about your test results, treatment options, and if necessary, the need  for more tests. Vaccines  Your health care provider may recommend certain vaccines, such as:  Influenza vaccine. This is recommended every year.  Tetanus, diphtheria, and acellular pertussis (Tdap, Td) vaccine.  You may need a Td booster every 10 years.  Zoster vaccine. You may need this after age 26.  Pneumococcal 13-valent conjugate (PCV13) vaccine. One dose is recommended after age 62.  Pneumococcal polysaccharide (PPSV23) vaccine. One dose is recommended after age 84. Talk to your health care provider about which screenings and vaccines you need and how often you need them. This information is not intended to replace advice given to you by your health care provider. Make sure you discuss any questions you have with your health care provider. Document Released: 08/28/2015 Document Revised: 04/20/2016 Document Reviewed: 06/02/2015 Elsevier Interactive Patient Education  2017 [place] Prevention in the Home Falls can cause injuries. They can happen to people of all ages. There are many things you can do to make your home safe and to help prevent falls. What can I do on the outside of my home?  Regularly fix the edges of walkways and driveways and fix any cracks.  Remove anything that might make you trip as you walk through a door, such as a raised step or threshold.  Trim any bushes or trees on the path to your home.  Use bright outdoor lighting.  Clear any walking paths of anything that might make someone trip, such as rocks or tools.  Regularly check to see if handrails are loose or broken. Make sure that both sides of any steps have handrails.  Any raised decks and porches should have guardrails on the edges.  Have any leaves, snow, or ice cleared regularly.  Use sand or salt on walking paths during winter.  Clean up any spills in your garage right away. This includes oil or grease spills. What can I do in the bathroom?  Use night lights.  Install grab bars by the toilet and in the tub and shower. Do not use towel bars as grab bars.  Use non-skid mats or decals in the tub or shower.  If you need to sit down in the shower, use a plastic, non-slip stool.  Keep the  floor dry. Clean up any water that spills on the floor as soon as it happens.  Remove soap buildup in the tub or shower regularly.  Attach bath mats securely with double-sided non-slip rug tape.  Do not have throw rugs and other things on the floor that can make you trip. What can I do in the bedroom?  Use night lights.  Make sure that you have a light by your bed that is easy to reach.  Do not use any sheets or blankets that are too big for your bed. They should not hang down onto the floor.  Have a firm chair that has side arms. You can use this for support while you get dressed.  Do not have throw rugs and other things on the floor that can make you trip. What can I do in the kitchen?  Clean up any spills right away.  Avoid walking on wet floors.  Keep items that you use a lot in easy-to-reach places.  If you need to reach something above you, use a strong step stool that has a grab bar.  Keep electrical cords out of the way.  Do not use floor polish or wax that makes floors slippery. If you must use wax, use  non-skid floor wax.  Do not have throw rugs and other things on the floor that can make you trip. What can I do with my stairs?  Do not leave any items on the stairs.  Make sure that there are handrails on both sides of the stairs and use them. Fix handrails that are broken or loose. Make sure that handrails are as long as the stairways.  Check any carpeting to make sure that it is firmly attached to the stairs. Fix any carpet that is loose or worn.  Avoid having throw rugs at the top or bottom of the stairs. If you do have throw rugs, attach them to the floor with carpet tape.  Make sure that you have a light switch at the top of the stairs and the bottom of the stairs. If you do not have them, ask someone to add them for you. What else can I do to help prevent falls?  Wear shoes that:  Do not have high heels.  Have rubber bottoms.  Are comfortable and fit  you well.  Are closed at the toe. Do not wear sandals.  If you use a stepladder:  Make sure that it is fully opened. Do not climb a closed stepladder.  Make sure that both sides of the stepladder are locked into place.  Ask someone to hold it for you, if possible.  Clearly mark and make sure that you can see:  Any grab bars or handrails.  First and last steps.  Where the edge of each step is.  Use tools that help you move around (mobility aids) if they are needed. These include:  Canes.  Walkers.  Scooters.  Crutches.  Turn on the lights when you go into a dark area. Replace any light bulbs as soon as they burn out.  Set up your furniture so you have a clear path. Avoid moving your furniture around.  If any of your floors are uneven, fix them.  If there are any pets around you, be aware of where they are.  Review your medicines with your doctor. Some medicines can make you feel dizzy. This can increase your chance of falling. Ask your doctor what other things that you can do to help prevent falls. This information is not intended to replace advice given to you by your health care provider. Make sure you discuss any questions you have with your health care provider. Document Released: 05/28/2009 Document Revised: 01/07/2016 Document Reviewed: 09/05/2014 Elsevier Interactive Patient Education  2017 [employer].

## 2019-09-06 NOTE — Progress Notes (Signed)
Subjective:   Andrea Browning is a 69 y.o. female who presents for Medicare Annual (Subsequent) preventive examination.  Review of Systems: N/A   This visit is being conducted through telemedicine via telephone at the nurse health advisor's home address due to the COVID-19 pandemic. This patient has given me verbal consent via doximity to conduct this visit, patient states they are participating from their home address. Patient and myself are on the telephone call. There is no referral for this visit. Some vital signs may be absent or patient reported.    Patient identification: identified by name, DOB, and current address   Cardiac Risk Factors include: advanced age (>66men, >9 women);diabetes mellitus;hypertension;dyslipidemia     Objective:     Vitals: There were no vitals taken for this visit.  There is no height or weight on file to calculate BMI.  Advanced Directives 09/06/2019 05/30/2018  Does Patient Have a Medical Advance Directive? No No  Would patient like information on creating a medical advance directive? No - Patient declined No - Patient declined    Tobacco Social History   Tobacco Use  Smoking Status Never Smoker  Smokeless Tobacco Never Used     Counseling given: Not Answered   Clinical Intake:  Pre-visit preparation completed: Yes  Pain : No/denies pain     Nutritional Risks: Nausea/ vomitting/ diarrhea(had COVID recently) Diabetes: Yes CBG done?: No Did pt. bring in CBG monitor from home?: No  How often do you need to have someone help you when you read instructions, pamphlets, or other written materials from your doctor or pharmacy?: 1 - Never What is the last grade level you completed in school?: 12th  Interpreter Needed?: No  Information entered by :: CJohnson, LPN  Past Medical History:  Diagnosis Date  . Cancer (HCC)    Skin  . Diabetes mellitus without complication ([place])   . Hyperlipidemia   . Hypertension   . Thyroid  disease    Past Surgical History:  Procedure Laterality Date  . COLONOSCOPY WITH PROPOFOL N/A 05/30/2018   Procedure: COLONOSCOPY WITH PROPOFOL;  Surgeon: Manya Silvas, MD;  Location: [facility] ENDOSCOPY;  Service: Endoscopy;  Laterality: N/A;  . DILATION AND CURETTAGE OF UTERUS    . WISDOM TOOTH EXTRACTION     Family History  Problem Relation Age of Onset  . Breast cancer Maternal Aunt 60  . Breast cancer Sister 86  . Diabetes Sister   . Colon cancer Mother   . Arthritis Father   . Diabetes Maternal Grandmother    Social History   Socioeconomic History  . Marital status: Married    Spouse name: Not on file  . Number of children: Not on file  . Years of education: Not on file  . Highest education level: Not on file  Occupational History  . Not on file  Tobacco Use  . Smoking status: Never Smoker  . Smokeless tobacco: Never Used  Substance and Sexual Activity  . Alcohol use: Never  . Drug use: Never  . Sexual activity: Not on file  Other Topics Concern  . Not on file  Social History Narrative  . Not on file   Social Determinants of Health   Financial Resource Strain: Low Risk   . Difficulty of Paying Living Expenses: Not hard at all  Food Insecurity: No Food Insecurity  . Worried About Charity fundraiser in the Last Year: Never true  . Ran Out of Food in the Last Year: Never true  Transportation Needs: No Transportation Needs  . Lack of Transportation (Medical): No  . Lack of Transportation (Non-Medical): No  Physical Activity: Inactive  . Days of Exercise per Week: 0 days  . Minutes of Exercise per Session: 0 min  Stress: No Stress Concern Present  . Feeling of Stress : Not at all  Social Connections:   . Frequency of Communication with Friends and Family: Not on file  . Frequency of Social Gatherings with Friends and Family: Not on file  . Attends Religious Services: Not on file  . Active Member of Clubs or Organizations: Not on file  . Attends Theatre manager Meetings: Not on file  . Marital Status: Not on file    Outpatient Encounter Medications as of 09/06/2019  Medication Sig  . Cholecalciferol (KP VITAMIN D3) 50 MCG (2000 UT) CAPS Take by mouth.  . levothyroxine (SYNTHROID, LEVOTHROID) 88 MCG tablet Take 88 mcg by mouth daily before breakfast.  . lisinopril (ZESTRIL) 10 MG tablet Take 1 tablet (10 mg total) by mouth daily.  . metFORMIN (GLUCOPHAGE-XR) 500 MG 24 hr tablet TAKE 1 TABLET BY MOUTH EVERY DAY WITH DINNER  . Omega-3 Fatty Acids (FISH OIL) 1200 MG CAPS Take by mouth.  . rosuvastatin (CRESTOR) 20 MG tablet Take 1 tablet (20 mg total) by mouth daily. Stop lovastatin.   No facility-administered encounter medications on file as of 09/06/2019.    Activities of Daily Living In your present state of health, do you have any difficulty performing the following activities: 09/06/2019  Hearing? N  Vision? N  Difficulty concentrating or making decisions? N  Walking or climbing stairs? N  Dressing or bathing? N  Doing errands, shopping? N  Preparing Food and eating ? N  Using the Toilet? N  In the past six months, have you accidently leaked urine? N  Do you have problems with loss of bowel control? N  Managing your Medications? N  Managing your Finances? N  Housekeeping or managing your Housekeeping? N  Some recent data might be hidden    Patient Care Team: Elby Beck, FNP as PCP - General (Nurse Practitioner)    Assessment:   This is a routine wellness examination for Andrea Browning.  Exercise Activities and Dietary recommendations Current Exercise Habits: The patient does not participate in regular exercise at present, Exercise limited by: None identified  Goals    . Patient Stated     09/06/2019, I will maintain and eat a healthier diet.       Fall Risk Fall Risk  09/06/2019 07/10/2019  Falls in the past year? 0 0  Comment - Emmi Telephone Survey: data to providers prior to load  Number falls in past yr: 0 -    Injury with Fall? 0 -  Risk for fall due to : Medication side effect -  Follow up Falls evaluation completed;Falls prevention discussed -   Is the patient's home free of loose throw rugs in walkways, pet beds, electrical cords, etc?   yes      Grab bars in the bathroom? no      Handrails on the stairs?   yes      Adequate lighting?   yes  Timed Get Up and Go performed: N/A  Depression Screen PHQ 2/9 Scores 09/06/2019 02/18/2019  PHQ - 2 Score 0 0  PHQ- 9 Score 0 0     Cognitive Function MMSE - Mini Mental State Exam 09/06/2019  Orientation to time 5  Orientation to Place  5  Registration 3  Attention/ Calculation 5  Recall 3  Language- repeat 1       Mini Cog  Mini-Cog screen was completed. Maximum score is 22. A value of 0 denotes this part of the MMSE was not completed or the patient failed this part of the Mini-Cog screening.  Immunization History  Administered Date(s) Administered  . Fluad Quad(high Dose 65+) 05/13/2019  . Influenza, High Dose Seasonal PF 05/23/2017, 05/11/2018  . Pneumococcal Conjugate-13 03/09/2017  . Pneumococcal Polysaccharide-23 03/08/2016  . Zoster 05/31/2013    Qualifies for Shingles Vaccine? Yes  Screening Tests Health Maintenance  Topic Date Due  . Hepatitis C Screening  [birth year]  . FOOT EXAM  04/24/1961  . OPHTHALMOLOGY EXAM  04/24/1961  . HEMOGLOBIN A1C  08/21/2019  . DEXA SCAN  04/15/2020 (Originally 04/24/2016)  . TETANUS/TDAP  09/05/2020 (Originally 04/24/1970)  . PNA vac Low Risk Adult (2 of 2 - PPSV23) 03/08/2021  . MAMMOGRAM  05/05/2021  . COLONOSCOPY  05/30/2028  . INFLUENZA VACCINE  Completed    Cancer Screenings: Lung: Low Dose CT Chest recommended if Age 28-80 years, 30 pack-year currently smoking OR have quit w/in 15 years. Patient does not qualify. Breast:  Up to date on Mammogram? Yes, completed 05/06/2019   Up to date of Bone Density/Dexa? No, Patient wants to wait and have this done in September with her  mammogram Colorectal: completed 05/30/2018  Additional Screenings:  Hepatitis C Screening: due     Plan:    Patient will maintain and eat a healthier diet.   I have personally reviewed and noted the following in the patient's chart:   . Medical and social history . Use of alcohol, tobacco or illicit drugs  . Current medications and supplements . Functional ability and status . Nutritional status . Physical activity . Advanced directives . List of other physicians . Hospitalizations, surgeries, and ER visits in previous 12 months . Vitals . Screenings to include cognitive, depression, and falls . Referrals and appointments  In addition, I have reviewed and discussed with patient certain preventive protocols, quality metrics, and best practice recommendations. A written personalized care plan for preventive services as well as general preventive health recommendations were provided to patient.     Andrez Grime, LPN  [phone]

## 2019-09-06 NOTE — Progress Notes (Signed)
PCP notes:  Health Maintenance: Eye exam- scheduled 09/09/2019 Dexa- Patient will have done at her next mammogram in September Tdap- insurance/financialnone   Abnormal Screenings: none   Patient concerns: none   Nurse concerns: none   Next PCP appt.: 09/13/2019 @ 10:30 am

## 2019-09-09 ENCOUNTER — Other Ambulatory Visit: Payer: Self-pay | Admitting: Family Medicine

## 2019-09-09 DIAGNOSIS — H5213 Myopia, bilateral: Secondary | ICD-10-CM | POA: Diagnosis not present

## 2019-09-09 LAB — HM DIABETES EYE EXAM

## 2019-09-09 LAB — HEPATITIS C ANTIBODY
Hepatitis C Ab: NONREACTIVE
SIGNAL TO CUT-OFF: 0.02 (ref <1.00)

## 2019-09-10 ENCOUNTER — Other Ambulatory Visit: Payer: Self-pay | Admitting: Family Medicine

## 2019-09-13 ENCOUNTER — Other Ambulatory Visit: Payer: Self-pay

## 2019-09-13 ENCOUNTER — Other Ambulatory Visit (HOSPITAL_COMMUNITY)
Admission: RE | Admit: 2019-09-13 | Discharge: 2019-09-13 | Disposition: A | Discharge status: Home / Self Care | Payer: PPO | Source: Ambulatory Visit | Attending: Family Medicine | Admitting: Family Medicine

## 2019-09-13 ENCOUNTER — Ambulatory Visit (INDEPENDENT_AMBULATORY_CARE_PROVIDER_SITE_OTHER): Payer: PPO | Admitting: Family Medicine

## 2019-09-13 ENCOUNTER — Encounter: Payer: Self-pay | Admitting: Family Medicine

## 2019-09-13 VITALS — BP 138/70 | HR 78 | Temp 98.1°F | Ht 66.25 in | Wt 183.0 lb

## 2019-09-13 DIAGNOSIS — I10 Essential (primary) hypertension: Secondary | ICD-10-CM

## 2019-09-13 DIAGNOSIS — Z0000 Encounter for general adult medical examination without abnormal findings: Secondary | ICD-10-CM | POA: Diagnosis not present

## 2019-09-13 DIAGNOSIS — Z1151 Encounter for screening for human papillomavirus (HPV): Secondary | ICD-10-CM | POA: Insufficient documentation

## 2019-09-13 DIAGNOSIS — Z124 Encounter for screening for malignant neoplasm of cervix: Secondary | ICD-10-CM | POA: Diagnosis not present

## 2019-09-13 DIAGNOSIS — K623 Rectal prolapse: Secondary | ICD-10-CM | POA: Diagnosis not present

## 2019-09-13 DIAGNOSIS — E119 Type 2 diabetes mellitus without complications: Secondary | ICD-10-CM | POA: Diagnosis not present

## 2019-09-13 DIAGNOSIS — E782 Mixed hyperlipidemia: Secondary | ICD-10-CM

## 2019-09-13 DIAGNOSIS — Z780 Asymptomatic menopausal state: Secondary | ICD-10-CM | POA: Insufficient documentation

## 2019-09-13 NOTE — Patient Instructions (Addendum)
Good to see you today  Please follow up in 6 months  Get Td and Shingrix vaccines at your pharmacy  Get Covid vaccine 30 days after having Covid or when you can get scheduled  Health Maintenance for Postmenopausal Women Menopause is a normal process in which your ability to get pregnant comes to an end. This process happens slowly over many months or years, usually between the ages of 64 and 48. Menopause is complete when you have missed your menstrual periods for 12 months. It is important to talk with your health care provider about some of the most common conditions that affect women after menopause (postmenopausal women). These include heart disease, cancer, and bone loss (osteoporosis). Adopting a healthy lifestyle and getting preventive care can help to promote your health and wellness. The actions you take can also lower your chances of developing some of these common conditions. What should I know about menopause? During menopause, you may get a number of symptoms, such as:  Hot flashes. These can be moderate or severe.  Night sweats.  Decrease in sex drive.  Mood swings.  Headaches.  Tiredness.  Irritability.  Memory problems.  Insomnia. Choosing to treat or not to treat these symptoms is a decision that you make with your health care provider. Do I need hormone replacement therapy?  Hormone replacement therapy is effective in treating symptoms that are caused by menopause, such as hot flashes and night sweats.  Hormone replacement carries certain risks, especially as you become older. If you are thinking about using estrogen or estrogen with progestin, discuss the benefits and risks with your health care provider. What is my risk for heart disease and stroke? The risk of heart disease, heart attack, and stroke increases as you age. One of the causes may be a change in the body's hormones during menopause. This can affect how your body uses dietary fats, triglycerides,  and cholesterol. Heart attack and stroke are medical emergencies. There are many things that you can do to help prevent heart disease and stroke. Watch your blood pressure  High blood pressure causes heart disease and increases the risk of stroke. This is more likely to develop in people who have high blood pressure readings, are of African descent, or are overweight.  Have your blood pressure checked: ? Every 3-5 years if you are 38-38 years of age. ? Every year if you are 53 years old or older. Eat a healthy diet   Eat a diet that includes plenty of vegetables, fruits, low-fat dairy products, and lean protein.  Do not eat a lot of foods that are high in solid fats, added sugars, or sodium. Get regular exercise Get regular exercise. This is one of the most important things you can do for your health. Most adults should:  Try to exercise for at least 150 minutes each week. The exercise should increase your heart rate and make you sweat (moderate-intensity exercise).  Try to do strengthening exercises at least twice each week. Do these in addition to the moderate-intensity exercise.  Spend less time sitting. Even light physical activity can be beneficial. Other tips  Work with your health care provider to achieve or maintain a healthy weight.  Do not use any products that contain nicotine or tobacco, such as cigarettes, e-cigarettes, and chewing tobacco. If you need help quitting, ask your health care provider.  Know your numbers. Ask your health care provider to check your cholesterol and your blood sugar (glucose). Continue to have your  blood tested as directed by your health care provider. Do I need screening for cancer? Depending on your health history and family history, you may need to have cancer screening at different stages of your life. This may include screening for:  Breast cancer.  Cervical cancer.  Lung cancer.  Colorectal cancer. What is my risk for  osteoporosis? After menopause, you may be at increased risk for osteoporosis. Osteoporosis is a condition in which bone destruction happens more quickly than new bone creation. To help prevent osteoporosis or the bone fractures that can happen because of osteoporosis, you may take the following actions:  If you are 2-84 years old, get at least 1,000 mg of calcium and at least 600 mg of vitamin D per day.  If you are older than age 4 but younger than age 2, get at least 1,200 mg of calcium and at least 600 mg of vitamin D per day.  If you are older than age 35, get at least 1,200 mg of calcium and at least 800 mg of vitamin D per day. Smoking and drinking excessive alcohol increase the risk of osteoporosis. Eat foods that are rich in calcium and vitamin D, and do weight-bearing exercises several times each week as directed by your health care provider. How does menopause affect my mental health? Depression may occur at any age, but it is more common as you become older. Common symptoms of depression include:  Low or sad mood.  Changes in sleep patterns.  Changes in appetite or eating patterns.  Feeling an overall lack of motivation or enjoyment of activities that you previously enjoyed.  Frequent crying spells. Talk with your health care provider if you think that you are experiencing depression. General instructions See your health care provider for regular wellness exams and vaccines. This may include:  Scheduling regular health, dental, and eye exams.  Getting and maintaining your vaccines. These include: ? Influenza vaccine. Get this vaccine each year before the flu season begins. ? Pneumonia vaccine. ? Shingles vaccine. ? Tetanus, diphtheria, and pertussis (Tdap) booster vaccine. Your health care provider may also recommend other immunizations. Tell your health care provider if you have ever been abused or do not feel safe at home. Summary  Menopause is a normal process in  which your ability to get pregnant comes to an end.  This condition causes hot flashes, night sweats, decreased interest in sex, mood swings, headaches, or lack of sleep.  Treatment for this condition may include hormone replacement therapy.  Take actions to keep yourself healthy, including exercising regularly, eating a healthy diet, watching your weight, and checking your blood pressure and blood sugar levels.  Get screened for cancer and depression. Make sure that you are up to date with all your vaccines. This information is not intended to replace advice given to you by your health care provider. Make sure you discuss any questions you have with your health care provider. Document Revised: 07/25/2018 Document Reviewed: 07/25/2018 Elsevier Patient Education  2020 [employer].

## 2019-09-13 NOTE — Progress Notes (Signed)
Subjective:    Patient ID: Andrea Browning, female    DOB: 1951-01-14, 69 y.o.   MRN: [id number]  HPI Chief Complaint  Patient presents with  . Annual Exam    Last CPE- 03/12/18 Mammo- 05/06/19 Pap- 2013 Colonoscopy- 05/30/2018 Tdap- 08/23/2008 Flu- 05/13/19 Eye- recent Dental- regular Exercise-active with housework  Had Covid 19 08/14/19- mild symptoms, all have resolved. Did not watch diet as carefully.   Bulging in vagina. Thinks it is her intestines. Rarely bothers her. Occasional constipation and area will have increased bulging. She is able to push area back into vagina.     Review of Systems  Constitutional: Negative.   HENT: Negative.   Eyes: Negative.   Respiratory: Negative.   Cardiovascular: Negative.   Gastrointestinal: Positive for constipation (occasional).  Endocrine: Negative.   Genitourinary: Positive for vaginal pain (occasional pain with bulging. ). Negative for decreased urine volume and difficulty urinating.  Musculoskeletal: Negative.   Skin: Negative.   Allergic/Immunologic: Negative.   Neurological: Negative.   Hematological: Negative.   Psychiatric/Behavioral: Negative.        Objective:   Physical Exam Vitals reviewed. Exam conducted with a chaperone present (CMA BrookeThompson).  Constitutional:      Appearance: Normal appearance. She is normal weight.  HENT:     Head: Normocephalic and atraumatic.     Right Ear: External ear normal.     Left Ear: External ear normal.  Eyes:     Conjunctiva/sclera: Conjunctivae normal.  Cardiovascular:     Rate and Rhythm: Normal rate and regular rhythm.     Heart sounds: Normal heart sounds.  Pulmonary:     Effort: Pulmonary effort is normal.     Breath sounds: Normal breath sounds.  Abdominal:     General: Abdomen is flat. Bowel sounds are normal.     Palpations: Abdomen is soft.  Genitourinary:    General: Normal vulva.     Exam position: Supine.     Labia:        Right: No rash,  tenderness, lesion or injury.        Left: No rash, tenderness, lesion or injury.      Vagina: Prolapsed vaginal walls (rectum) present.     Cervix: Normal.     Rectum: No tenderness or external hemorrhoid. Normal anal tone.  Musculoskeletal:     Cervical back: Normal range of motion and neck supple.     Right lower leg: No edema.     Left lower leg: No edema.  Skin:    General: Skin is warm and dry.  Neurological:     Mental Status: She is alert and oriented to person, place, and time.  Psychiatric:        Mood and Affect: Mood normal.        Behavior: Behavior normal.        Thought Content: Thought content normal.        Judgment: Judgment normal.       BP 138/70 (BP Location: Left Arm, Patient Position: Sitting, Cuff Size: Normal)   Pulse 78   Temp 98.1 F (36.7 C) (Temporal)   Ht 5' 6.25" (1.683 m)   Wt 183 lb (83 kg)   SpO2 97%   BMI 29.31 kg/m  Wt Readings from Last 3 Encounters:  09/13/19 183 lb (83 kg)  02/18/19 188 lb (85.3 kg)  05/30/18 185 lb (83.9 kg)   Depression screen [facility] 2/9 09/13/2019 09/06/2019 02/18/2019  Decreased Interest 0 0 0  Down, Depressed, Hopeless 0 0 0  PHQ - 2 Score 0 0 0  Altered sleeping 0 0 0  Tired, decreased energy - 0 0  Change in appetite - 0 0  Feeling bad or failure about yourself  - 0 0  Trouble concentrating - 0 0  Moving slowly or fidgety/restless - 0 0  Suicidal thoughts - 0 0  PHQ-9 Score 0 0 0  Difficult doing work/chores - Not difficult at all Not difficult at all   Diabetic Foot Exam - Simple   Simple Foot Form Diabetic Foot exam was performed with the following findings: Yes 09/13/2019  8:35 PM  Visual Inspection No deformities, no ulcerations, no other skin breakdown bilaterally: Yes Sensation Testing Intact to touch and monofilament testing bilaterally: Yes Pulse Check Posterior Tibialis and Dorsalis pulse intact bilaterally: Yes Comments Thickened right great toenail.         Assessment & Plan:  1.  Annual physical exam - Discussed and encouraged healthy lifestyle choices- adequate sleep, regular exercise, stress management and healthy food choices.    2. Screening for cervical cancer - Cytology - PAP([place])  3. Rectal prolapse - discussed with patient who states it is a chronic problem and doesn't bother her. Discussed referral to colorectal surgeon to discuss repair which is recommended. She declines at this time. She can change her mind and I can put in referral at any time.   4. Controlled type 2 diabetes mellitus without complication, without long-term current use of insulin (HCC) - slightly elevated hgba1c to 7.2 from 6.8. Has gone back to watching her diet more carefully.   5. Mixed hyperlipidemia - significantly improved on rosuvastatin 20 mg, ldl decreased from 131 to 87.   6. Essential hypertension - well controlled on current medication  - follow up in 6 months  This visit occurred during the SARS-CoV-2 public health emergency.  Safety protocols were in place, including screening questions prior to the visit, additional usage of staff PPE, and extensive cleaning of exam room while observing appropriate contact time as indicated for disinfecting solutions.      Clarene Reamer, FNP-BC  [facility]  09/13/2019 8:36 PM

## 2019-09-16 LAB — CYTOLOGY - PAP
Comment: NEGATIVE
Diagnosis: NEGATIVE
High risk HPV: NEGATIVE

## 2020-02-10 ENCOUNTER — Other Ambulatory Visit: Payer: Self-pay | Admitting: Family Medicine

## 2020-02-10 DIAGNOSIS — E782 Mixed hyperlipidemia: Secondary | ICD-10-CM

## 2020-02-10 DIAGNOSIS — E119 Type 2 diabetes mellitus without complications: Secondary | ICD-10-CM

## 2020-03-02 ENCOUNTER — Other Ambulatory Visit: Payer: Self-pay | Admitting: Family Medicine

## 2020-03-02 NOTE — Telephone Encounter (Signed)
Rx was last refilled 03/06/19 for #90 with 3 refills. Patient was last seen on 09-13-19 and has an upcoming 6 month f/u on 03-13-20. Is this ok to refill?

## 2020-03-13 ENCOUNTER — Ambulatory Visit: Payer: PPO | Admitting: Family Medicine

## 2020-03-23 ENCOUNTER — Telehealth (INDEPENDENT_AMBULATORY_CARE_PROVIDER_SITE_OTHER): Payer: PPO | Admitting: Family Medicine

## 2020-03-23 VITALS — BP 133/62 | Temp 97.6°F | Wt 178.0 lb

## 2020-03-23 DIAGNOSIS — Z1231 Encounter for screening mammogram for malignant neoplasm of breast: Secondary | ICD-10-CM | POA: Diagnosis not present

## 2020-03-23 DIAGNOSIS — E2839 Other primary ovarian failure: Secondary | ICD-10-CM

## 2020-03-23 DIAGNOSIS — I10 Essential (primary) hypertension: Secondary | ICD-10-CM | POA: Diagnosis not present

## 2020-03-23 DIAGNOSIS — E119 Type 2 diabetes mellitus without complications: Secondary | ICD-10-CM

## 2020-03-23 NOTE — Progress Notes (Signed)
Virtual Visit via Video Note  I connected with Andrea Browning on 03/23/20 at 12:00 PM EDT by a video enabled telemedicine application and verified that I am speaking with the correct person using two identifiers.  Location: Patient: In her home Provider: [place]  Persons participating in virtual visit: Patient and provider   I discussed the limitations of evaluation and management by telemedicine and the availability of in person appointments. The patient expressed understanding and agreed to proceed.  History of Present Illness: This is a 69 year old female who presents today via video visit for follow-up of chronic medical conditions.  She had a recent exposure at church to COVID-19 and is unable to come into the office due to her current quarantine status.  She is not having any symptoms of COVID-19 and she is fully vaccinated.  Diabetes mellitus type 2-patient continues to watch her diet carefully.  She checks her blood sugars at home and they run between 110-1 30.  She has intentionally lost 10 pounds over the last year.  She is tolerating all medications and denies side effects.  She denies chest pain, shortness of breath, abdominal pain, diarrhea, constipation, joint pain, leg swelling.   Observations/Objective:  BP 133/62   Temp 97.6 F (36.4 C)   Wt 178 lb (80.7 kg)   BMI 28.51 kg/m  Wt Readings from Last 3 Encounters:  03/23/20 178 lb (80.7 kg)  09/13/19 183 lb (83 kg)  02/18/19 188 lb (85.3 kg)     Assessment and Plan: 1. Controlled type 2 diabetes mellitus without complication, without long-term current use of insulin ([place]) -Well-controlled on current medication.  Last hemoglobin A1c January 2021 was 7.2. -Follow-up in 6 months for annual exam/lab work  2. Essential hypertension -Well-controlled, continue current meds  3. Encounter for screening mammogram for malignant neoplasm of breast -Patient will call to schedule - MM 3D SCREEN BREAST  BILATERAL; Future  4. Estrogen deficiency -Patient will call to schedule - DG Bone Density; Future   Clarene Reamer, FNP-BC  [facility] [place] [facility]  03/24/2020 7:42 AM   Follow Up Instructions:    I discussed the assessment and treatment plan with the patient. The patient was provided an opportunity to ask questions and all were answered. The patient agreed with the plan and demonstrated an understanding of the instructions.   The patient was advised to call back or seek an in-person evaluation if the symptoms worsen or if the condition fails to improve as anticipated.   Elby Beck, FNP

## 2020-03-24 ENCOUNTER — Encounter: Payer: Self-pay | Admitting: Family Medicine

## 2020-04-06 ENCOUNTER — Other Ambulatory Visit: Payer: Self-pay | Admitting: Family Medicine

## 2020-04-06 DIAGNOSIS — E119 Type 2 diabetes mellitus without complications: Secondary | ICD-10-CM

## 2020-05-28 ENCOUNTER — Ambulatory Visit
Admission: RE | Admit: 2020-05-28 | Discharge: 2020-05-28 | Disposition: A | Discharge status: Home / Self Care | Payer: PPO | Source: Ambulatory Visit | Attending: Family Medicine | Admitting: Family Medicine

## 2020-05-28 ENCOUNTER — Other Ambulatory Visit: Payer: Self-pay

## 2020-05-28 DIAGNOSIS — Z13820 Encounter for screening for osteoporosis: Secondary | ICD-10-CM | POA: Diagnosis not present

## 2020-05-28 DIAGNOSIS — E2839 Other primary ovarian failure: Secondary | ICD-10-CM | POA: Diagnosis not present

## 2020-05-28 DIAGNOSIS — Z1231 Encounter for screening mammogram for malignant neoplasm of breast: Secondary | ICD-10-CM | POA: Insufficient documentation

## 2020-05-28 DIAGNOSIS — Z780 Asymptomatic menopausal state: Secondary | ICD-10-CM | POA: Diagnosis not present

## 2020-05-28 DIAGNOSIS — R29890 Loss of height: Secondary | ICD-10-CM | POA: Diagnosis not present

## 2020-05-28 DIAGNOSIS — E039 Hypothyroidism, unspecified: Secondary | ICD-10-CM | POA: Diagnosis not present

## 2020-08-17 LAB — HM DIABETES FOOT EXAM

## 2020-09-09 DIAGNOSIS — H2513 Age-related nuclear cataract, bilateral: Secondary | ICD-10-CM | POA: Diagnosis not present

## 2020-09-09 LAB — HM DIABETES EYE EXAM

## 2020-09-11 ENCOUNTER — Other Ambulatory Visit: Payer: Self-pay

## 2020-09-11 ENCOUNTER — Ambulatory Visit (INDEPENDENT_AMBULATORY_CARE_PROVIDER_SITE_OTHER): Payer: PPO | Admitting: Family Medicine

## 2020-09-11 ENCOUNTER — Encounter: Payer: Self-pay | Admitting: Family Medicine

## 2020-09-11 VITALS — BP 110/64 | HR 53 | Temp 97.9°F | Ht 66.5 in | Wt 182.5 lb

## 2020-09-11 DIAGNOSIS — E1159 Type 2 diabetes mellitus with other circulatory complications: Secondary | ICD-10-CM

## 2020-09-11 DIAGNOSIS — E119 Type 2 diabetes mellitus without complications: Secondary | ICD-10-CM | POA: Diagnosis not present

## 2020-09-11 DIAGNOSIS — E1169 Type 2 diabetes mellitus with other specified complication: Secondary | ICD-10-CM | POA: Diagnosis not present

## 2020-09-11 DIAGNOSIS — K623 Rectal prolapse: Secondary | ICD-10-CM | POA: Diagnosis not present

## 2020-09-11 DIAGNOSIS — I152 Hypertension secondary to endocrine disorders: Secondary | ICD-10-CM

## 2020-09-11 DIAGNOSIS — E039 Hypothyroidism, unspecified: Secondary | ICD-10-CM | POA: Diagnosis not present

## 2020-09-11 DIAGNOSIS — E785 Hyperlipidemia, unspecified: Secondary | ICD-10-CM | POA: Diagnosis not present

## 2020-09-11 LAB — POCT GLYCOSYLATED HEMOGLOBIN (HGB A1C): Hemoglobin A1C: 6.2 % — AB (ref 4.0–5.6)

## 2020-09-11 MED ORDER — LEVOTHYROXINE SODIUM 88 MCG PO TABS: ORAL_TABLET | ORAL | 3 refills | Status: DC

## 2020-09-11 NOTE — Assessment & Plan Note (Addendum)
Stable, chronic.  Continue current medication.  Due for recheck.  Levothyroxine 88 mcg daily

## 2020-09-11 NOTE — Assessment & Plan Note (Signed)
Asymptomatic if avoiding constipation.

## 2020-09-11 NOTE — Assessment & Plan Note (Addendum)
   Last year.. lipids at goal.. due for recheck now. LDL at goal < 100 on  crestor 20 mg daily

## 2020-09-11 NOTE — Assessment & Plan Note (Signed)
Stable, chronic.  Continue current medication.   Lisinopril 10 mg daily   

## 2020-09-11 NOTE — Assessment & Plan Note (Signed)
Stable, chronic.  Continue current medication.    metformin 500 mg daily XR.

## 2020-09-11 NOTE — Progress Notes (Signed)
Patient ID: Andrea Browning, female    DOB: May 17, 1951, 70 y.o.   MRN: [id number]  This visit was conducted in person.  BP 110/64   Pulse (!) 53   Temp 97.9 F (36.6 C) (Temporal)   Ht 5' 6.5" (1.689 m)   Wt 182 lb 8 oz (82.8 kg)   SpO2 96%   BMI 29.01 kg/m    CC: Transfer of Care fro [facility] Subjective:   HPI: Andrea Browning is a 70 y.o. female presenting on 09/11/2020 for Establish Care (TOC from D. Gessner)  Last AMW CPX: 09/06/2019 and  09/13/19  Diabetes:   Dx 6 years ago.  Good control on metformin 500 mg daily XR. Lab Results  Component Value Date   HGBA1C 6.2 (A) 09/11/2020  Using medications without difficulties: none Hypoglycemic episodes: none Hyperglycemic episodes:none Feet problems: none Blood Sugars averaging: FBS 107-124  eye exam within last year: yesterday, no DR.  Hypertension:   Well controlled on ACEI. Using medication without problems or lightheadedness:  none Chest pain with exertion: none Edema:none Short of breath: none Average home BPs: Other issues:  Elevated Cholesterol:  At goal on statin Using medications without problems: Muscle aches:  Diet compliance: Exercise: Other complaints:  Hypothyroid  At gool on levo 88 mcg at last check.. due for re-eval.      Relevant past medical, surgical, family and social history reviewed and updated as indicated. Interim medical history since our last visit reviewed. Allergies and medications reviewed and updated. Outpatient Medications Prior to Visit  Medication Sig Dispense Refill  . Cholecalciferol (VITAMIN D3) 125 MCG (5000 UT) CAPS Take 1 capsule by mouth daily.    Marland Kitchen levothyroxine (SYNTHROID) 88 MCG tablet TAKE 1 TAB BY MOUTH 1XDAY ON AN EMPTY STOMACH W/GLASS OF WATER AT LEAST 30-60 MINS BEFORE BREAKFAST 90 tablet 3  . lisinopril (ZESTRIL) 10 MG tablet TAKE 1 TABLET BY MOUTH EVERY DAY 90 tablet 3  . metFORMIN (GLUCOPHAGE-XR) 500 MG 24 hr tablet TAKE 1 TABLET BY MOUTH EVERY  DAY WITH DINNER 90 tablet 3  . Omega-3 Fatty Acids (FISH OIL) 1000 MG CAPS Take 1 capsule by mouth daily.    . rosuvastatin (CRESTOR) 20 MG tablet TAKE 1 TABLET (20 MG TOTAL) BY MOUTH DAILY. STOP LOVASTATIN. 90 tablet 3  . Cholecalciferol (KP VITAMIN D3) 50 MCG (2000 UT) CAPS Take by mouth.    . Omega-3 Fatty Acids (FISH OIL) 1200 MG CAPS Take by mouth.     No facility-administered medications prior to visit.     Per HPI unless specifically indicated in ROS section below Review of Systems  Constitutional: Negative for fatigue, fever and unexpected weight change.  HENT: Negative for congestion, ear pain, sinus pressure, sneezing, sore throat and trouble swallowing.   Eyes: Negative for pain and itching.  Respiratory: Negative for cough, shortness of breath and wheezing.   Cardiovascular: Negative for chest pain, palpitations and leg swelling.  Gastrointestinal: Negative for abdominal pain, blood in stool, constipation, diarrhea and nausea.  Genitourinary: Negative for difficulty urinating, dysuria, hematuria, menstrual problem and vaginal discharge.  Skin: Negative for rash.  Neurological: Negative for syncope, weakness, light-headedness, numbness and headaches.  Psychiatric/Behavioral: Negative for confusion and dysphoric mood. The patient is not nervous/anxious.    Objective:  BP 110/64   Pulse (!) 53   Temp 97.9 F (36.6 C) (Temporal)   Ht 5' 6.5" (1.689 m)   Wt 182 lb 8 oz (82.8 kg)   SpO2 96%  BMI 29.01 kg/m   Wt Readings from Last 3 Encounters:  09/11/20 182 lb 8 oz (82.8 kg)  03/23/20 178 lb (80.7 kg)  09/13/19 183 lb (83 kg)      Physical Exam Constitutional:      General: She is not in acute distress.Vital signs are normal.     Appearance: Normal appearance. She is well-developed and well-nourished. She is not ill-appearing or toxic-appearing.  HENT:     Head: Normocephalic.     Right Ear: Hearing, tympanic membrane, ear canal and external ear normal. Tympanic  membrane is not erythematous, retracted or bulging.     Left Ear: Hearing, tympanic membrane, ear canal and external ear normal. Tympanic membrane is not erythematous, retracted or bulging.     Nose: No mucosal edema or rhinorrhea.     Right Sinus: No maxillary sinus tenderness or frontal sinus tenderness.     Left Sinus: No maxillary sinus tenderness or frontal sinus tenderness.     Mouth/Throat:     Mouth: Oropharynx is clear and moist and mucous membranes are normal.     Pharynx: Uvula midline.  Eyes:     General: Lids are normal. Lids are everted, no foreign bodies appreciated.     Extraocular Movements: EOM normal.     Conjunctiva/sclera: Conjunctivae normal.     Pupils: Pupils are equal, round, and reactive to light.  Neck:     Thyroid: No thyroid mass or thyromegaly.     Vascular: No carotid bruit.     Trachea: Trachea normal.  Cardiovascular:     Rate and Rhythm: Normal rate and regular rhythm.     Pulses: Normal pulses and intact distal pulses.     Heart sounds: Normal heart sounds, S1 normal and S2 normal. No murmur heard. No friction rub. No gallop.   Pulmonary:     Effort: Pulmonary effort is normal. No tachypnea or respiratory distress.     Breath sounds: Normal breath sounds. No decreased breath sounds, wheezing, rhonchi or rales.  Abdominal:     General: Bowel sounds are normal.     Palpations: Abdomen is soft.     Tenderness: There is no abdominal tenderness.  Musculoskeletal:     Cervical back: Normal range of motion and neck supple.  Skin:    General: Skin is warm, dry and intact.     Findings: No rash.  Neurological:     Mental Status: She is alert.  Psychiatric:        Mood and Affect: Mood is not anxious or depressed.        Speech: Speech normal.        Behavior: Behavior normal. Behavior is cooperative.        Thought Content: Thought content normal.        Cognition and Memory: Cognition and memory normal.        Judgment: Judgment normal.       Diabetic foot exam: Normal inspection No skin breakdown No calluses  Normal DP pulses Normal sensation to light touch and monofilament Nails normal  Results for orders placed or performed in visit on 09/13/19  Cytology - PAP([facility])  Result Value Ref Range   High risk HPV Negative    Adequacy      Satisfactory for evaluation; transformation zone component PRESENT.   Diagnosis      - Negative for intraepithelial lesion or malignancy (NILM)   Comment Normal Reference Range HPV - Negative     This visit occurred  during the SARS-CoV-2 public health emergency.  Safety protocols were in place, including screening questions prior to the visit, additional usage of staff PPE, and extensive cleaning of exam room while observing appropriate contact time as indicated for disinfecting solutions.   COVID 19 screen:  No recent travel or known exposure to COVID19 The patient denies respiratory symptoms of COVID 19 at this time. The importance of social distancing was discussed today.   Assessment and Plan    She will return for AMW.  Problem List Items Addressed This Visit    Controlled type 2 diabetes mellitus without complication, without long-term current use of insulin ([place]) - Primary (Chronic)    Stable, chronic.  Continue current medication.    metformin 500 mg daily XR.      Relevant Orders   POCT glycosylated hemoglobin (Hb A1C) (Completed)   Comprehensive metabolic panel   Lipid panel   Hyperlipidemia associated with type 2 diabetes mellitus (HCC) (Chronic)      Last year.. lipids at goal.. due for recheck now. LDL at goal < 100 on  crestor 20 mg daily       Hypertension associated with diabetes (HCC) (Chronic)    Stable, chronic.  Continue current medication.   Lisinopril 10 mg daily      Hypothyroidism (Chronic)    Stable, chronic.  Continue current medication.  Due for recheck.  Levothyroxine 88 mcg daily      Relevant Medications   levothyroxine  (SYNTHROID) 88 MCG tablet   Other Relevant Orders   TSH   T4, free   T3, free   Rectal prolapse     Asymptomatic if avoiding constipation.          Eliezer Lofts, MD

## 2020-09-21 ENCOUNTER — Encounter: Payer: Self-pay | Admitting: Family Medicine

## 2020-10-13 ENCOUNTER — Other Ambulatory Visit: Payer: Self-pay

## 2020-10-13 ENCOUNTER — Other Ambulatory Visit (INDEPENDENT_AMBULATORY_CARE_PROVIDER_SITE_OTHER): Payer: PPO

## 2020-10-13 DIAGNOSIS — E119 Type 2 diabetes mellitus without complications: Secondary | ICD-10-CM | POA: Diagnosis not present

## 2020-10-13 DIAGNOSIS — E039 Hypothyroidism, unspecified: Secondary | ICD-10-CM

## 2020-10-13 LAB — COMPREHENSIVE METABOLIC PANEL
ALT: 17 U/L (ref 0–35)
AST: 13 U/L (ref 0–37)
Albumin: 4.4 g/dL (ref 3.5–5.2)
Alkaline Phosphatase: 49 U/L (ref 39–117)
BUN: 26 mg/dL — ABNORMAL HIGH (ref 6–23)
CO2: 31 mEq/L (ref 19–32)
Calcium: 9.8 mg/dL (ref 8.4–10.5)
Chloride: 103 mEq/L (ref 96–112)
Creatinine, Ser: 0.78 mg/dL (ref 0.40–1.20)
GFR: 77.47 mL/min (ref >60.00)
Glucose, Bld: 112 mg/dL — ABNORMAL HIGH (ref 70–99)
Potassium: 4.4 mEq/L (ref 3.5–5.1)
Sodium: 139 mEq/L (ref 135–145)
Total Bilirubin: 0.7 mg/dL (ref 0.2–1.2)
Total Protein: 6.8 g/dL (ref 6.0–8.3)

## 2020-10-13 LAB — LIPID PANEL
Cholesterol: 144 mg/dL (ref 0–200)
HDL: 44.6 mg/dL (ref >39.00)
LDL Cholesterol: 83 mg/dL (ref 0–99)
NonHDL: 99.12
Total CHOL/HDL Ratio: 3
Triglycerides: 83 mg/dL (ref 0.0–149.0)
VLDL: 16.6 mg/dL (ref 0.0–40.0)

## 2020-10-13 LAB — TSH: TSH: 0.12 u[IU]/mL — ABNORMAL LOW (ref 0.35–4.50)

## 2020-10-13 LAB — T3, FREE: T3, Free: 3.2 pg/mL (ref 2.3–4.2)

## 2020-10-13 LAB — T4, FREE: Free T4: 1.16 ng/dL (ref 0.60–1.60)

## 2020-10-14 NOTE — Progress Notes (Signed)
No critical labs need to be addressed urgently. We will discuss labs in detail at upcoming office visit.   

## 2020-10-15 ENCOUNTER — Telehealth: Payer: Self-pay | Admitting: Family Medicine

## 2020-10-15 NOTE — Chronic Care Management (AMB) (Signed)
  Chronic Care Management   Note  10/15/2020 Name: [name] MRN: [id number] DOB: 24-Dec-1950  [name] is a 70 y.o. year old female who is a primary care patient of Bedsole, Amy E, MD. I reached out to [name] by phone today in response to a referral sent by [name]'s PCP, Jinny Sanders, MD.   Ms. Forester was given information about Chronic Care Management services today including:  1. CCM service includes personalized support from designated clinical staff supervised by her physician, including individualized plan of care and coordination with other care providers 2. 24/7 contact phone numbers for assistance for urgent and routine care needs. 3. Service will only be billed when office clinical staff spend 20 minutes or more in a month to coordinate care. 4. Only one practitioner may furnish and bill the service in a calendar month. 5. The patient may stop CCM services at any time (effective at the end of the month) by phone call to the office staff.   Patient agreed to services and verbal consent obtained.   Follow up plan:   Leon

## 2020-10-16 ENCOUNTER — Other Ambulatory Visit: Payer: Self-pay

## 2020-10-16 ENCOUNTER — Ambulatory Visit (INDEPENDENT_AMBULATORY_CARE_PROVIDER_SITE_OTHER): Payer: PPO

## 2020-10-16 DIAGNOSIS — Z0000 Encounter for general adult medical examination without abnormal findings: Secondary | ICD-10-CM | POA: Diagnosis not present

## 2020-10-16 NOTE — Progress Notes (Signed)
Subjective:   Andrea Browning is a 70 y.o. female who presents for Medicare Annual (Subsequent) preventive examination.  Review of Systems: N/A      I connected with the patient today by telephone and verified that I am speaking with the correct person using two identifiers. Location patient: home Location nurse: work Persons participating in the telephone visit: patient, nurse.   I discussed the limitations, risks, security and privacy concerns of performing an evaluation and management service by telephone and the availability of in person appointments. I also discussed with the patient that there may be a patient responsible charge related to this service. The patient expressed understanding and verbally consented to this telephonic visit.        Cardiac Risk Factors include: advanced age (>51men, >76 women);diabetes mellitus;hypertension;Other (see comment), Risk factor comments: hyperlipidemia     Objective:    Today's Vitals   There is no height or weight on file to calculate BMI.  Advanced Directives 10/16/2020 09/06/2019 05/30/2018  Does Patient Have a Medical Advance Directive? No No No  Does patient want to make changes to medical advance directive? No - Patient declined - -  Would patient like information on creating a medical advance directive? - No - Patient declined No - Patient declined    Current Medications (verified) Outpatient Encounter Medications as of 10/16/2020  Medication Sig  . Cholecalciferol (VITAMIN D3) 125 MCG (5000 UT) CAPS Take 1 capsule by mouth daily.  Marland Kitchen levothyroxine (SYNTHROID) 88 MCG tablet TAKE 1 TAB BY MOUTH 1XDAY ON AN EMPTY STOMACH W/GLASS OF WATER AT LEAST 30-60 MINS BEFORE BREAKFAST  . lisinopril (ZESTRIL) 10 MG tablet TAKE 1 TABLET BY MOUTH EVERY DAY  . metFORMIN (GLUCOPHAGE-XR) 500 MG 24 hr tablet TAKE 1 TABLET BY MOUTH EVERY DAY WITH DINNER  . Omega-3 Fatty Acids (FISH OIL) 1000 MG CAPS Take 1 capsule by mouth daily.  . rosuvastatin  (CRESTOR) 20 MG tablet TAKE 1 TABLET (20 MG TOTAL) BY MOUTH DAILY. STOP LOVASTATIN.   No facility-administered encounter medications on file as of 10/16/2020.    Allergies (verified) Patient has no known allergies.   History: Past Medical History:  Diagnosis Date  . Cancer (HCC)    Skin  . Diabetes mellitus without complication ([place])   . Hyperlipidemia   . Hypertension   . Thyroid disease    Past Surgical History:  Procedure Laterality Date  . COLONOSCOPY WITH PROPOFOL N/A 05/30/2018   Procedure: COLONOSCOPY WITH PROPOFOL;  Surgeon: Manya Silvas, MD;  Location: [facility] ENDOSCOPY;  Service: Endoscopy;  Laterality: N/A;  . DILATION AND CURETTAGE OF UTERUS    . WISDOM TOOTH EXTRACTION     Family History  Problem Relation Age of Onset  . Breast cancer Maternal Aunt 60  . Breast cancer Sister 89  . Diabetes Sister   . Colon cancer Mother   . Arthritis Father   . Diabetes Maternal Grandmother   . Heart disease Brother   . Hyperlipidemia Brother   . Heart disease Brother   . Hyperlipidemia Brother    Social History   Socioeconomic History  . Marital status: Married    Spouse name:  Darryl  . Number of children: 2  . Years of education: Not on file  . Highest education level: Not on file  Occupational History  . Not on file  Tobacco Use  . Smoking status: Never Smoker  . Smokeless tobacco: Never Used  Substance and Sexual Activity  . Alcohol use: Never  .  Drug use: Never  . Sexual activity: Not Currently  Other Topics Concern  . Not on file  Social History Narrative  . Not on file   Social Determinants of Health   Financial Resource Strain: Low Risk   . Difficulty of Paying Living Expenses: Not hard at all  Food Insecurity: No Food Insecurity  . Worried About Charity fundraiser in the Last Year: Never true  . Ran Out of Food in the Last Year: Never true  Transportation Needs: No Transportation Needs  . Lack of Transportation (Medical): No  . Lack of  Transportation (Non-Medical): No  Physical Activity: Inactive  . Days of Exercise per Week: 0 days  . Minutes of Exercise per Session: 0 min  Stress: No Stress Concern Present  . Feeling of Stress : Not at all  Social Connections: Not on file    Tobacco Counseling Counseling given: Not Answered   Clinical Intake:  Pre-visit preparation completed: Yes  Pain : No/denies pain     Nutritional Risks: None Diabetes: Yes CBG done?: No Did pt. bring in CBG monitor from home?: No  How often do you need to have someone help you when you read instructions, pamphlets, or other written materials from your doctor or pharmacy?: 1 - Never What is the last grade level you completed in school?: 12th  Diabetic: Yes Nutrition Risk Assessment:  Has the patient had any N/V/D within the last 2 months?  No  Does the patient have any non-healing wounds?  No  Has the patient had any unintentional weight loss or weight gain?  No   Diabetes:  Is the patient diabetic?  Yes  If diabetic, was a CBG obtained today?  No  telephone visit Did the patient bring in their glucometer from home?  No  telephone visit  How often do you monitor your CBG's? Every morning.   Financial Strains and Diabetes Management:  Are you having any financial strains with the device, your supplies or your medication? No .  Does the patient want to be seen by Chronic Care Management for management of their diabetes?  No  Would the patient like to be referred to a Nutritionist or for Diabetic Management?  No   Diabetic Exams:  Diabetic Eye Exam: Completed 09/09/2020 Diabetic Foot Exam: Completed 08/17/2020   Interpreter Needed?: No  Information entered by :: CJohnson, LPN   Activities of Daily Living In your present state of health, do you have any difficulty performing the following activities: 10/16/2020  Hearing? N  Vision? N  Difficulty concentrating or making decisions? N  Walking or climbing stairs? N   Dressing or bathing? N  Doing errands, shopping? N  Preparing Food and eating ? N  Using the Toilet? N  In the past six months, have you accidently leaked urine? N  Do you have problems with loss of bowel control? N  Managing your Medications? N  Managing your Finances? N  Housekeeping or managing your Housekeeping? N  Some recent data might be hidden    Patient Care Team: Jinny Sanders, MD as PCP - General (Family Medicine) Debbora Dus, [facility] as Pharmacist (Pharmacist)  Indicate any recent Medical Services you may have received from other than [facility] providers in the past year (date may be approximate).     Assessment:   This is a routine wellness examination for Andrea Browning.  Hearing/Vision screen  Hearing Screening   125Hz  250Hz  500Hz  1000Hz  2000Hz  3000Hz  4000Hz  6000Hz  8000Hz   Right ear:  Left ear:           Vision Screening Comments: Patient gets annual eye exams   Dietary issues and exercise activities discussed: Current Exercise Habits: The patient does not participate in regular exercise at present, Exercise limited by: None identified  Goals    . Patient Stated     09/06/2019, I will maintain and eat a healthier diet.    . Patient Stated     10/16/2020, I will maintain and continue medications as prescribed.       Depression Screen PHQ 2/9 Scores 10/16/2020 09/13/2019 09/06/2019 02/18/2019  PHQ - 2 Score 0 0 0 0  PHQ- 9 Score 0 0 0 0    Fall Risk Fall Risk  10/16/2020 09/13/2019 09/06/2019 07/10/2019  Falls in the past year? 0 0 0 0  Comment - - - Emmi Telephone Survey: data to providers prior to load  Number falls in past yr: 0 - 0 -  Injury with Fall? 0 - 0 -  Risk for fall due to : Medication side effect - Medication side effect -  Follow up Falls evaluation completed;Falls prevention discussed - Falls evaluation completed;Falls prevention discussed -    FALL RISK PREVENTION PERTAINING TO THE HOME:  Any stairs in or around the home? Yes  If so, are there  any without handrails? No  Home free of loose throw rugs in walkways, pet beds, electrical cords, etc? Yes  Adequate lighting in your home to reduce risk of falls? Yes   ASSISTIVE DEVICES UTILIZED TO PREVENT FALLS:  Life alert? No  Use of a cane, walker or w/c? No  Grab bars in the bathroom? No  Shower chair or bench in shower? No  Elevated toilet seat or a handicapped toilet? No   TIMED UP AND GO:  Was the test performed? N/A telephone visit .    Cognitive Function: MMSE - Mini Mental State Exam 10/16/2020 09/06/2019  Orientation to time 5 5  Orientation to Place 5 5  Registration 3 3  Attention/ Calculation 5 5  Recall 3 3  Language- repeat 1 1  Mini Cog  Mini-Cog screen was completed. Maximum score is 22. A value of 0 denotes this part of the MMSE was not completed or the patient failed this part of the Mini-Cog screening.        Immunizations Immunization History  Administered Date(s) Administered  . Fluad Quad(high Dose 65+) 05/13/2019  . Influenza, High Dose Seasonal PF 05/23/2017, 05/11/2018, 05/11/2020  . Moderna Sars-Covid-2 Vaccination 12/30/2019, 01/27/2020, 08/11/2020  . Pneumococcal Conjugate-13 03/09/2017  . Pneumococcal Polysaccharide-23 03/08/2016  . Tdap 03/02/2020  . Zoster 05/31/2013  . Zoster Recombinat (Shingrix) 03/02/2020, 07/14/2020    TDAP status: Up to date  Flu Vaccine status: Up to date  Pneumococcal vaccine status: Up to date  Covid-19 vaccine status: Completed vaccines  Qualifies for Shingles Vaccine? Yes   Zostavax completed Yes   Shingrix Completed?: Yes  Screening Tests Health Maintenance  Topic Date Due  . PNA vac Low Risk Adult (2 of 2 - PPSV23) 03/08/2021  . HEMOGLOBIN A1C  03/11/2021  . FOOT EXAM  08/17/2021  . OPHTHALMOLOGY EXAM  09/09/2021  . MAMMOGRAM  05/28/2022  . COLONOSCOPY (Pts 45-38yrs Insurance coverage will need to be confirmed)  05/30/2028  . TETANUS/TDAP  03/02/2030  . INFLUENZA VACCINE  Completed  .  DEXA SCAN  Completed  . COVID-19 Vaccine  Completed  . Hepatitis C Screening  Completed  . HPV VACCINES  Aged  Out    Health Maintenance  There are no preventive care reminders to display for this patient.  Colorectal cancer screening: Type of screening: Colonoscopy. Completed 05/30/2018. Repeat every 10 years  Mammogram status: Completed 05/28/2020. Repeat every year  Bone Density status: Completed 05/28/2020. Results reflect: Bone density results: NORMAL. Repeat every 2-5 years.  Lung Cancer Screening: (Low Dose CT Chest recommended if Age 56-80 years, 30 pack-year currently smoking OR have quit w/in 15years.) does not qualify.    Additional Screening:  Hepatitis C Screening: does qualify; Completed 09/06/2019  Vision Screening: Recommended annual ophthalmology exams for early detection of glaucoma and other disorders of the eye. Is the patient up to date with their annual eye exam?  Yes  Who is the provider or what is the name of the office in which the patient attends annual eye exams? [facility], Dr. Holley Bouche  If pt is not established with a provider, would they like to be referred to a provider to establish care? No .   Dental Screening: Recommended annual dental exams for proper oral hygiene  Community Resource Referral / Chronic Care Management: CRR required this visit?  No   CCM required this visit?  No      Plan:     I have personally reviewed and noted the following in the patient's chart:   . Medical and social history . Use of alcohol, tobacco or illicit drugs  . Current medications and supplements . Functional ability and status . Nutritional status . Physical activity . Advanced directives . List of other physicians . Hospitalizations, surgeries, and ER visits in previous 12 months . Vitals . Screenings to include cognitive, depression, and falls . Referrals and appointments  In addition, I have reviewed and discussed with patient certain  preventive protocols, quality metrics, and best practice recommendations. A written personalized care plan for preventive services as well as general preventive health recommendations were provided to patient.   Due to this being a telephonic visit, the after visit summary with patients personalized plan was offered to patient via office or my-chart. Patient preferred to pick up at office at next visit or via mychart.   Andrez Grime, LPN   11/17/9199

## 2020-10-16 NOTE — Progress Notes (Signed)
PCP notes:  Health Maintenance: No gaps noted   Abnormal Screenings: none   Patient concerns: none   Nurse concerns: none   Next PCP appt.: 10/20/2020 @ 11 am

## 2020-10-16 NOTE — Patient Instructions (Signed)
Andrea Browning , Thank you for taking time to come for your Medicare Wellness Visit. I appreciate your ongoing commitment to your health goals. Please review the following plan we discussed and let me know if I can assist you in the future.   Screening recommendations/referrals: Colonoscopy: Up to date, completed 05/30/2018, due 05/2028 Mammogram: Up to date, completed 05/28/2020, due 05/2021 Bone Density: Up to date, completed 05/28/2020, due 2-5 years  Recommended yearly ophthalmology/optometry visit for glaucoma screening and checkup Recommended yearly dental visit for hygiene and checkup  Vaccinations: Influenza vaccine: Up to date, completed 05/11/2020, due 03/2021 Pneumococcal vaccine: Completed series Tdap vaccine: Up to date, completed 03/02/2020, due 02/2030 Shingles vaccine: Completed series   Covid-19:Completed series  Advanced directives: Advance directive discussed with you today. Even though you declined this today please call our office should you change your mind and we can give you the proper paperwork for you to fill out.   Conditions/risks identified: diabetes, hypertension, hyperlipidemia   Next appointment: Follow up in one year for your annual wellness visit    Preventive Care 61 Years and Older, Female Preventive care refers to lifestyle choices and visits with your health care provider that can promote health and wellness. What does preventive care include?  A yearly physical exam. This is also called an annual well check.  Dental exams once or twice a year.  Routine eye exams. Ask your health care provider how often you should have your eyes checked.  Personal lifestyle choices, including:  Daily care of your teeth and gums.  Regular physical activity.  Eating a healthy diet.  Avoiding tobacco and drug use.  Limiting alcohol use.  Practicing safe sex.  Taking low-dose aspirin every day.  Taking vitamin and mineral supplements as recommended by your  health care provider. What happens during an annual well check? The services and screenings done by your health care provider during your annual well check will depend on your age, overall health, lifestyle risk factors, and family history of disease. Counseling  Your health care provider may ask you questions about your:  Alcohol use.  Tobacco use.  Drug use.  Emotional well-being.  Home and relationship well-being.  Sexual activity.  Eating habits.  History of falls.  Memory and ability to understand (cognition).  Work and work Statistician.  Reproductive health. Screening  You may have the following tests or measurements:  Height, weight, and BMI.  Blood pressure.  Lipid and cholesterol levels. These may be checked every 5 years, or more frequently if you are over 38 years old.  Skin check.  Lung cancer screening. You may have this screening every year starting at age 70 if you have a 30-pack-year history of smoking and currently smoke or have quit within the past 15 years.  Fecal occult blood test (FOBT) of the stool. You may have this test every year starting at age 51.  Flexible sigmoidoscopy or colonoscopy. You may have a sigmoidoscopy every 5 years or a colonoscopy every 10 years starting at age 71.  Hepatitis C blood test.  Hepatitis B blood test.  Sexually transmitted disease (STD) testing.  Diabetes screening. This is done by checking your blood sugar (glucose) after you have not eaten for a while (fasting). You may have this done every 1-3 years.  Bone density scan. This is done to screen for osteoporosis. You may have this done starting at age 70.  Mammogram. This may be done every 1-2 years. Talk to your health care provider  about how often you should have regular mammograms. Talk with your health care provider about your test results, treatment options, and if necessary, the need for more tests. Vaccines  Your health care provider may recommend  certain vaccines, such as:  Influenza vaccine. This is recommended every year.  Tetanus, diphtheria, and acellular pertussis (Tdap, Td) vaccine. You may need a Td booster every 10 years.  Zoster vaccine. You may need this after age 35.  Pneumococcal 13-valent conjugate (PCV13) vaccine. One dose is recommended after age 27.  Pneumococcal polysaccharide (PPSV23) vaccine. One dose is recommended after age 70. Talk to your health care provider about which screenings and vaccines you need and how often you need them. This information is not intended to replace advice given to you by your health care provider. Make sure you discuss any questions you have with your health care provider. Document Released: 08/28/2015 Document Revised: 04/20/2016 Document Reviewed: 06/02/2015 Elsevier Interactive Patient Education  2017 [place] Prevention in the Home Falls can cause injuries. They can happen to people of all ages. There are many things you can do to make your home safe and to help prevent falls. What can I do on the outside of my home?  Regularly fix the edges of walkways and driveways and fix any cracks.  Remove anything that might make you trip as you walk through a door, such as a raised step or threshold.  Trim any bushes or trees on the path to your home.  Use bright outdoor lighting.  Clear any walking paths of anything that might make someone trip, such as rocks or tools.  Regularly check to see if handrails are loose or broken. Make sure that both sides of any steps have handrails.  Any raised decks and porches should have guardrails on the edges.  Have any leaves, snow, or ice cleared regularly.  Use sand or salt on walking paths during winter.  Clean up any spills in your garage right away. This includes oil or grease spills. What can I do in the bathroom?  Use night lights.  Install grab bars by the toilet and in the tub and shower. Do not use towel bars as  grab bars.  Use non-skid mats or decals in the tub or shower.  If you need to sit down in the shower, use a plastic, non-slip stool.  Keep the floor dry. Clean up any water that spills on the floor as soon as it happens.  Remove soap buildup in the tub or shower regularly.  Attach bath mats securely with double-sided non-slip rug tape.  Do not have throw rugs and other things on the floor that can make you trip. What can I do in the bedroom?  Use night lights.  Make sure that you have a light by your bed that is easy to reach.  Do not use any sheets or blankets that are too big for your bed. They should not hang down onto the floor.  Have a firm chair that has side arms. You can use this for support while you get dressed.  Do not have throw rugs and other things on the floor that can make you trip. What can I do in the kitchen?  Clean up any spills right away.  Avoid walking on wet floors.  Keep items that you use a lot in easy-to-reach places.  If you need to reach something above you, use a strong step stool that has a grab bar.  Keep electrical cords out of the way.  Do not use floor polish or wax that makes floors slippery. If you must use wax, use non-skid floor wax.  Do not have throw rugs and other things on the floor that can make you trip. What can I do with my stairs?  Do not leave any items on the stairs.  Make sure that there are handrails on both sides of the stairs and use them. Fix handrails that are broken or loose. Make sure that handrails are as long as the stairways.  Check any carpeting to make sure that it is firmly attached to the stairs. Fix any carpet that is loose or worn.  Avoid having throw rugs at the top or bottom of the stairs. If you do have throw rugs, attach them to the floor with carpet tape.  Make sure that you have a light switch at the top of the stairs and the bottom of the stairs. If you do not have them, ask someone to add them  for you. What else can I do to help prevent falls?  Wear shoes that:  Do not have high heels.  Have rubber bottoms.  Are comfortable and fit you well.  Are closed at the toe. Do not wear sandals.  If you use a stepladder:  Make sure that it is fully opened. Do not climb a closed stepladder.  Make sure that both sides of the stepladder are locked into place.  Ask someone to hold it for you, if possible.  Clearly mark and make sure that you can see:  Any grab bars or handrails.  First and last steps.  Where the edge of each step is.  Use tools that help you move around (mobility aids) if they are needed. These include:  Canes.  Walkers.  Scooters.  Crutches.  Turn on the lights when you go into a dark area. Replace any light bulbs as soon as they burn out.  Set up your furniture so you have a clear path. Avoid moving your furniture around.  If any of your floors are uneven, fix them.  If there are any pets around you, be aware of where they are.  Review your medicines with your doctor. Some medicines can make you feel dizzy. This can increase your chance of falling. Ask your doctor what other things that you can do to help prevent falls. This information is not intended to replace advice given to you by your health care provider. Make sure you discuss any questions you have with your health care provider. Document Released: 05/28/2009 Document Revised: 01/07/2016 Document Reviewed: 09/05/2014 Elsevier Interactive Patient Education  2017 [employer].

## 2020-10-20 ENCOUNTER — Encounter: Payer: Self-pay | Admitting: Family Medicine

## 2020-10-20 ENCOUNTER — Ambulatory Visit (INDEPENDENT_AMBULATORY_CARE_PROVIDER_SITE_OTHER): Payer: PPO | Admitting: Family Medicine

## 2020-10-20 VITALS — BP 123/61 | HR 52 | Temp 98.3°F | Ht 66.5 in | Wt 184.2 lb

## 2020-10-20 DIAGNOSIS — Z0000 Encounter for general adult medical examination without abnormal findings: Secondary | ICD-10-CM | POA: Diagnosis not present

## 2020-10-20 DIAGNOSIS — I152 Hypertension secondary to endocrine disorders: Secondary | ICD-10-CM

## 2020-10-20 DIAGNOSIS — E1169 Type 2 diabetes mellitus with other specified complication: Secondary | ICD-10-CM

## 2020-10-20 DIAGNOSIS — E785 Hyperlipidemia, unspecified: Secondary | ICD-10-CM | POA: Diagnosis not present

## 2020-10-20 DIAGNOSIS — E1159 Type 2 diabetes mellitus with other circulatory complications: Secondary | ICD-10-CM

## 2020-10-20 DIAGNOSIS — E119 Type 2 diabetes mellitus without complications: Secondary | ICD-10-CM

## 2020-10-20 NOTE — Assessment & Plan Note (Signed)
Stable, chronic.  Continue current medication.    lisinopril 10 mg daily 

## 2020-10-20 NOTE — Patient Instructions (Addendum)
Work on regular exercise.Marland Kitchen 3-5 days a week.  Keep working on healthy low carb, low fat diet.

## 2020-10-20 NOTE — Assessment & Plan Note (Signed)
At goal LDL < 100 On crestor 20 mg daily.

## 2020-10-20 NOTE — Progress Notes (Signed)
Patient ID: Andrea Browning, female    DOB: 06-13-1951, 70 y.o.   MRN: [id number]  This visit was conducted in person.  BP 123/61   Pulse (!) 52   Temp 98.3 F (36.8 C) (Temporal)   Ht 5' 6.5" (1.689 m)   Wt 184 lb 4 oz (83.6 kg)   SpO2 98%   BMI 29.29 kg/m    CC:  Chief Complaint  Patient presents with  . Annual Exam    Part 2    Subjective:   HPI: Andrea Browning is a 70 y.o. female presenting on 10/20/2020 for Annual Exam (Part 2)  The patient presents for complete physical and review of chronic health problems. He/She also has the following acute concerns today:  The patient saw a LPN or RN for medicare wellness visit.  Prevention and wellness was reviewed in detail. Note reviewed and important notes copied below. Health Maintenance: No gaps noted   Abnormal Screenings: none  Diabetes:   Well controlled on metformin 500 mg XR once daily. Lab Results  Component Value Date   HGBA1C 6.2 (A) 09/11/2020  Using medications without difficulties: Hypoglycemic episodes:none Hyperglycemic episodes: none Feet problems:none Blood Sugars averaging: FBS 109 eye exam within last year: yes  Hypertension:    Good control on lisinopril 10 mg daily BP Readings from Last 3 Encounters:  10/20/20 123/61  09/11/20 110/64  03/23/20 133/62  Using medication without problems or lightheadedness:  none Chest pain with exertion:none Edema:none Short of breath: none Average home BPs: Other issues:  Elevated Cholesterol:  At goal LDL < 100 On crestor 20 mg daily. Lab Results  Component Value Date   CHOL 144 10/13/2020   HDL 44.60 10/13/2020   LDLCALC 83 10/13/2020   TRIG 83.0 10/13/2020   CHOLHDL 3 10/13/2020  Using medications without problems:none Muscle aches:  none Diet compliance: moderate Exercise:none Other complaints:   Hypothyroid  TSH low but free T3 and free T4 in normal range on levothyroxine 88 mcg daily. Lab Results  Component Value Date   TSH  0.12 (L) 10/13/2020        Relevant past medical, surgical, family and social history reviewed and updated as indicated. Interim medical history since our last visit reviewed. Allergies and medications reviewed and updated. Outpatient Medications Prior to Visit  Medication Sig Dispense Refill  . Cholecalciferol (VITAMIN D3) 125 MCG (5000 UT) CAPS Take 1 capsule by mouth daily.    Marland Kitchen levothyroxine (SYNTHROID) 88 MCG tablet TAKE 1 TAB BY MOUTH 1XDAY ON AN EMPTY STOMACH W/GLASS OF WATER AT LEAST 30-60 MINS BEFORE BREAKFAST 90 tablet 3  . lisinopril (ZESTRIL) 10 MG tablet TAKE 1 TABLET BY MOUTH EVERY DAY 90 tablet 3  . metFORMIN (GLUCOPHAGE-XR) 500 MG 24 hr tablet TAKE 1 TABLET BY MOUTH EVERY DAY WITH DINNER 90 tablet 3  . Omega-3 Fatty Acids (FISH OIL) 1000 MG CAPS Take 1 capsule by mouth daily.    . rosuvastatin (CRESTOR) 20 MG tablet TAKE 1 TABLET (20 MG TOTAL) BY MOUTH DAILY. STOP LOVASTATIN. 90 tablet 3   No facility-administered medications prior to visit.     Per HPI unless specifically indicated in ROS section below Review of Systems  Constitutional: Negative for fatigue and fever.  HENT: Negative for congestion.   Eyes: Negative for pain.  Respiratory: Negative for cough and shortness of breath.   Cardiovascular: Negative for chest pain, palpitations and leg swelling.  Gastrointestinal: Negative for abdominal pain.  Genitourinary: Negative for dysuria  and vaginal bleeding.  Musculoskeletal: Negative for back pain.  Neurological: Negative for syncope, light-headedness and headaches.  Psychiatric/Behavioral: Negative for dysphoric mood.   Objective:  BP 123/61   Pulse (!) 52   Temp 98.3 F (36.8 C) (Temporal)   Ht 5' 6.5" (1.689 m)   Wt 184 lb 4 oz (83.6 kg)   SpO2 98%   BMI 29.29 kg/m   Wt Readings from Last 3 Encounters:  10/20/20 184 lb 4 oz (83.6 kg)  09/11/20 182 lb 8 oz (82.8 kg)  03/23/20 178 lb (80.7 kg)      Physical Exam    Results for orders placed or  performed in visit on 10/13/20  T3, free  Result Value Ref Range   T3, Free 3.2 2.3 - 4.2 pg/mL  T4, free  Result Value Ref Range   Free T4 1.16 0.60 - 1.60 ng/dL  TSH  Result Value Ref Range   TSH 0.12 (L) 0.35 - 4.50 uIU/mL  Lipid panel  Result Value Ref Range   Cholesterol 144 0 - 200 mg/dL   Triglycerides 83.0 0.0 - 149.0 mg/dL   HDL 44.60 >39.00 mg/dL   VLDL 16.6 0.0 - 40.0 mg/dL   LDL Cholesterol 83 0 - 99 mg/dL   Total CHOL/HDL Ratio 3    NonHDL 99.12   Comprehensive metabolic panel  Result Value Ref Range   Sodium 139 135 - 145 mEq/L   Potassium 4.4 3.5 - 5.1 mEq/L   Chloride 103 96 - 112 mEq/L   CO2 31 19 - 32 mEq/L   Glucose, Bld 112 (H) 70 - 99 mg/dL   BUN 26 (H) 6 - 23 mg/dL   Creatinine, Ser 0.78 0.40 - 1.20 mg/dL   Total Bilirubin 0.7 0.2 - 1.2 mg/dL   Alkaline Phosphatase 49 39 - 117 U/L   AST 13 0 - 37 U/L   ALT 17 0 - 35 U/L   Total Protein 6.8 6.0 - 8.3 g/dL   Albumin 4.4 3.5 - 5.2 g/dL   GFR 77.47 >60.00 mL/min   Calcium 9.8 8.4 - 10.5 mg/dL    This visit occurred during the SARS-CoV-2 public health emergency.  Safety protocols were in place, including screening questions prior to the visit, additional usage of staff PPE, and extensive cleaning of exam room while observing appropriate contact time as indicated for disinfecting solutions.   COVID 19 screen:  No recent travel or known exposure to COVID19 The patient denies respiratory symptoms of COVID 19 at this time. The importance of social distancing was discussed today.   Assessment and Plan The patient's preventative maintenance and recommended screening tests for an annual wellness exam were reviewed in full today. Brought up to date unless services declined.  Counselled on the importance of diet, exercise, and its role in overall health and mortality. The patient's FH and SH was reviewed, including their home life, tobacco status, and drug and alcohol status.   Vaccines: COVID, Td, PNA, flu  shingles uptodate Pap/DVE:  Not indicated Mammo: 05/2020 Bone Density: Normal 05/2020, repeat in 5 years Colon: Jefm Bryant, Dr. Vira Agar, 05/30/2018,  Mother with colon cancer, q 5 years  Smoking Status: none ETOH/ drug [id number]  Hep C:   done  Problem List Items Addressed This Visit    Controlled type 2 diabetes mellitus without complication, without long-term current use of insulin ([place]) (Chronic)    Stable, chronic.  Continue current medication.   Well controlled on metformin 500 mg XR once daily.  Hyperlipidemia associated with type 2 diabetes mellitus ([facility]) (Chronic)    At goal LDL < 100 On crestor 20 mg daily.       Hypertension associated with diabetes ([place]) (Chronic)    Stable, chronic.  Continue current medication.   lisinopril 10 mg daily       Other Visit Diagnoses    Routine general medical examination at a health care facility    -  Primary         Eliezer Lofts, MD

## 2020-10-20 NOTE — Assessment & Plan Note (Signed)
Stable, chronic.  Continue current medication.   Well controlled on metformin 500 mg XR once daily.

## 2020-11-18 ENCOUNTER — Telehealth: Payer: Self-pay

## 2020-11-18 NOTE — Chronic Care Management (AMB) (Addendum)
Chronic Care Management Pharmacy Assistant   Name: Andrea Browning  MRN: [id number] DOB: Jan 20, 1951  Andrea Browning is an 70 y.o. year old female who presents for her initial CCM visit with the clinical pharmacist.  Reason for Encounter: Chart Review for CPP visit on 11/26/20   Conditions to be addressed/monitored: HTN, HLD, DMII and Hypothyroidism   Recent office visits:  10/20/20- Dr. Eliezer Lofts- PCP - No changes. Follow up in 6 months for DM with labs prior 09/11/20- Dr. Eliezer Lofts- PCP- No changes. Labs for CMP, A1C, TSH, Lipids, T3 and T4 ordered. Follow up in 4 weeks.  Recent consult visits:  09/09/20- Dr. Remo Lipps [place]- Ophthalmology 05/28/21- Imaging- DEXA scan.   Hospital visits:  None in previous 6 months  Medications: Outpatient Encounter Medications as of 11/18/2020  Medication Sig   Cholecalciferol (VITAMIN D3) 125 MCG (5000 UT) CAPS Take 1 capsule by mouth daily.   levothyroxine (SYNTHROID) 88 MCG tablet TAKE 1 TAB BY MOUTH 1XDAY ON AN EMPTY STOMACH W/GLASS OF WATER AT LEAST 30-60 MINS BEFORE BREAKFAST   lisinopril (ZESTRIL) 10 MG tablet TAKE 1 TABLET BY MOUTH EVERY DAY   metFORMIN (GLUCOPHAGE-XR) 500 MG 24 hr tablet TAKE 1 TABLET BY MOUTH EVERY DAY WITH DINNER   Omega-3 Fatty Acids (FISH OIL) 1000 MG CAPS Take 1 capsule by mouth daily.   rosuvastatin (CRESTOR) 20 MG tablet TAKE 1 TABLET (20 MG TOTAL) BY MOUTH DAILY. STOP LOVASTATIN.   No facility-administered encounter medications on file as of 11/18/2020.    Lab Results  Component Value Date/Time   HGBA1C 6.2 (A) 09/11/2020 08:48 AM   HGBA1C 7.2 (H) 09/06/2019 09:21 AM   HGBA1C 6.8 (H) 02/18/2019 10:18 AM   MICROALBUR 3.7 (H) 09/06/2019 09:21 AM     BP Readings from Last 3 Encounters:  10/20/20 123/61  09/11/20 110/64  03/23/20 133/62      Have you seen any other providers since your last visit with PCP? No  Any changes in your medications or health? No  Any side effects from any  medications? No  Do you have an symptoms or problems not managed by your medications? No  Any concerns about your health right now? No  Has your provider asked that you check blood pressure, blood sugar, or follow special diet at home? Yes- Checks blood sugar  Do you get any type of exercise on a regular basis? Yes no formal exercise- but active around her home  Can you think of a goal you would like to reach for your health? No, notes that she recently reached her weight goal. She was about 220 and now she is 180.  Do you have any problems getting your medications? No Patient's preferred pharmacy is:  [id number] [address], [place] [address] Phone: [phone] Fax: [phone]   Is there anything that you would like to discuss during the appointment? No   Andrea Browning Selman was reminded to have all medications, supplements and any blood glucose and blood pressure readings available for review with Debbora Dus, Pharm. D, at her telephone visit on 11/26/20 at 8:30 .    Star Rating Drugs:  Medication:  Last Fill: Day Supply Lisinopril 10 mg 09/06/20 90 Metformin 500 mg  10/01/20 90 Rosuvastatin 20 mg 11/06/20 90  Follow-Up:  Pharmacist Review  Debbora Dus, CPP notified  Margaretmary Dys, [place] Pharmacy Assistant [phone]  I have reviewed the care management and  care coordination activities outlined in this encounter and I am certifying that I agree with the content of this note. No further action required.  Debbora Dus, PharmD Clinical Pharmacist [facility] [phone]

## 2020-11-26 ENCOUNTER — Ambulatory Visit (INDEPENDENT_AMBULATORY_CARE_PROVIDER_SITE_OTHER): Payer: PPO

## 2020-11-26 ENCOUNTER — Other Ambulatory Visit: Payer: Self-pay

## 2020-11-26 DIAGNOSIS — E119 Type 2 diabetes mellitus without complications: Secondary | ICD-10-CM

## 2020-11-26 DIAGNOSIS — I152 Hypertension secondary to endocrine disorders: Secondary | ICD-10-CM

## 2020-11-26 DIAGNOSIS — E039 Hypothyroidism, unspecified: Secondary | ICD-10-CM | POA: Diagnosis not present

## 2020-11-26 DIAGNOSIS — E785 Hyperlipidemia, unspecified: Secondary | ICD-10-CM

## 2020-11-26 DIAGNOSIS — E1159 Type 2 diabetes mellitus with other circulatory complications: Secondary | ICD-10-CM | POA: Diagnosis not present

## 2020-11-26 DIAGNOSIS — E1169 Type 2 diabetes mellitus with other specified complication: Secondary | ICD-10-CM

## 2020-11-26 NOTE — Patient Instructions (Signed)
November 26, 2020  Dear Andrea Browning,  It was a pleasure meeting you during our initial appointment on November 26, 2020. Below is a summary of the goals we discussed and components of chronic care management. Please contact me anytime with questions or concerns.   Visit Information  Patient Care Plan: CCM Pharmacy Care Plan    Problem Identified: CHL AMB "PATIENT-SPECIFIC PROBLEM"     Long-Range Goal: Disease Management   Start Date: 11/26/2020  Priority: High  Note:     Current Barriers:  . None identified  Pharmacist Clinical Goal(s):  Marland Kitchen Patient will contact provider office for questions/concerns as evidenced notation of same in electronic health record through collaboration with PharmD and provider.   Interventions: . 1:1 collaboration with Andrea Sanders, MD regarding development and update of comprehensive plan of care as evidenced by provider attestation and co-signature . Inter-disciplinary care team collaboration (see longitudinal plan of care) . Comprehensive medication review performed; medication list updated in electronic medical record  Hypertension (BP goal <130/80) -Controlled - Clinic BP within goal  -Current treatment: . Lisinopril 10 mg - 1 tablet daily at supper -Medications previously tried: none -Pt reports started ACE primarily for kidney protection -Current home readings: none, does not monitor  -Current dietary habits: loosely keto, limits carbs, uses stevia when baking, almond flour; loves green beans and salads, eats three meals each day  -Current exercise habits: none formal, but stays busy - walks long driveway, pulls weeds, chores -Denies hypotensive/hypertensive symptoms -Sleeps well, rested in the morning. -Educated on Symptoms of hypotension and importance of maintaining adequate hydration; -Recommended to continue current medication  Hyperlipidemia: (LDL goal < 100) -Controlled - LDL 83 -Current treatment: . Rosuvastatin 20 mg - 1 tablet  daily at supper . Fish oil 1200 mg - 1 capsule daily  -Medications previously tried: lovastatin  -Educated on Cholesterol goals;  -Recommended to continue current medication  Diabetes (A1c goal <7%) -Controlled - A1c 6.2% -Current medications: Marland Kitchen Metformin 500 mg ER - 1 tablet at dinner -Medications previously tried: none  -Weight loss of 40 lbs over past 5-6 years since DM diagnosis, maintaining current weight around 185 lbs  -Current home glucose readings - checks every morning . fasting glucose: 100-116s . post prandial glucose: none -Denies hypoglycemic/hyperglycemic symptoms -Denies any lows < 70 -Educated on A1c and blood sugar goals;  -She enjoys checking BG daily to keep a check on her diet.  -Counseled to check feet daily and get yearly eye exams - up to date  -Recommended to continue current medication  Hypothyroidism (TSH, T4 WNL) Query Controlled - TSH low 10/13/20 -Current medications:  Levothyroxine 88 mcg - 1 tablet empty stomach before breakfast -Pt confirms taking before breakfast daily -Last TSH below normal limits. Pt does not appear to have any symptoms of over-treatment and has been stable on current dose for 5+ years. PCP aware and continued current dose for now. -Recommended to continue current medication - consider re-eval TSH at next OV Sept 2022.   OTCs: Vitamin D3 5000 IU daily - within normal limits 2021 Denies other OTCs  Patient Goals/Self-Care Activities . Patient will:   - continue medication prescribed  - continue low carb diet and active lifestyle - call CCM pharmacist with any health concerns   Follow Up Plan: Telephone follow up appointment with care management team member scheduled for: 12 months      Andrea Browning was given information about Chronic Care Management services today including:  1. CCM  service includes personalized support from designated clinical staff supervised by her physician, including individualized plan of care and  coordination with other care providers 2. 24/7 contact phone numbers for assistance for urgent and routine care needs. 3. Standard insurance, coinsurance, copays and deductibles apply for chronic care management only during months in which we provide at least 20 minutes of these services. Most insurances cover these services at 100%, however patients may be responsible for any copay, coinsurance and/or deductible if applicable. This service may help you avoid the need for more expensive face-to-face services. 4. Only one practitioner may furnish and bill the service in a calendar month. 5. The patient may stop CCM services at any time (effective at the end of the month) by phone call to the office staff.  Patient agreed to services and verbal consent obtained.   Patient verbalizes understanding of instructions provided today and agrees to view in [place].   Debbora Dus, PharmD Clinical Pharmacist [facility] at [facility] [phone]

## 2020-11-26 NOTE — Progress Notes (Signed)
Chronic Care Management Pharmacy Note  11/26/2020 Name:  Andrea Browning MRN:  [id number] DOB:  09/20/1950  Subjective: Andrea Browning is an 70 y.o. year old female who is a primary patient of Bedsole, Amy E, MD.  The CCM team was consulted for assistance with disease management and care coordination needs.    Engaged with patient by telephone for initial visit in response to provider referral for pharmacy case management and/or care coordination services.   Consent to Services:  The patient was given the following information about Chronic Care Management services today, agreed to services, and gave verbal consent: 1. CCM service includes personalized support from designated clinical staff supervised by the primary care provider, including individualized plan of care and coordination with other care providers 2. 24/7 contact phone numbers for assistance for urgent and routine care needs. 3. Service will only be billed when office clinical staff spend 20 minutes or more in a month to coordinate care. 4. Only one practitioner may furnish and bill the service in a calendar month. 5.The patient may stop CCM services at any time (effective at the end of the month) by phone call to the office staff. 6. The patient will be responsible for cost sharing (co-pay) of up to 20% of the service fee (after annual deductible is met). Patient agreed to services and consent obtained.  Patient Care Team: Jinny Sanders, MD as PCP - General (Family Medicine) Debbora Dus, [facility] as Pharmacist (Pharmacist)  Recent office visits:  10/20/20- Dr. Eliezer Lofts- PCP - No changes. Follow up in 6 months for DM with labs prior  09/11/20- Dr. Eliezer Lofts- PCP- No changes. Labs for CMP, A1C, TSH, Lipids, T3 and T4 ordered. Follow up in 4 weeks.  Recent consult visits:  09/09/20- Dr. Remo Lipps [place]- Ophthalmology  05/28/21- Imaging- DEXA scan.   Hospital visits: None in previous 6 months  Objective:  Lab  Results  Component Value Date   CREATININE 0.78 10/13/2020   BUN 26 (H) 10/13/2020   GFR 77.47 10/13/2020   NA 139 10/13/2020   K 4.4 10/13/2020   CALCIUM 9.8 10/13/2020   CO2 31 10/13/2020   GLUCOSE 112 (H) 10/13/2020    Lab Results  Component Value Date/Time   HGBA1C 6.2 (A) 09/11/2020 08:48 AM   HGBA1C 7.2 (H) 09/06/2019 09:21 AM   HGBA1C 6.8 (H) 02/18/2019 10:18 AM   GFR 77.47 10/13/2020 08:29 AM   GFR 60.74 02/18/2019 10:18 AM   MICROALBUR 3.7 (H) 09/06/2019 09:21 AM    Last diabetic Eye exam:  Lab Results  Component Value Date/Time   HMDIABEYEEXA No Retinopathy 09/09/2020 12:00 AM    Last diabetic Foot exam:  Lab Results  Component Value Date/Time   HMDIABFOOTEX done 08/17/2020 12:00 AM     Lab Results  Component Value Date   CHOL 144 10/13/2020   HDL 44.60 10/13/2020   LDLCALC 83 10/13/2020   TRIG 83.0 10/13/2020   CHOLHDL 3 10/13/2020    Hepatic Function Latest Ref Rng & Units 10/13/2020 02/18/2019  Total Protein 6.0 - 8.3 g/dL 6.8 7.1  Albumin 3.5 - 5.2 g/dL 4.4 4.5  AST 0 - 37 U/L 13 12  ALT 0 - 35 U/L 17 13  Alk Phosphatase 39 - 117 U/L 49 55  Total Bilirubin 0.2 - 1.2 mg/dL 0.7 0.4    Lab Results  Component Value Date/Time   TSH 0.12 (L) 10/13/2020 08:29 AM   TSH 0.97 02/18/2019 10:18 AM   FREET4 1.16  10/13/2020 08:29 AM    CBC Latest Ref Rng & Units 02/18/2019  WBC 4.0 - 10.5 K/uL 6.3  Hemoglobin 12.0 - 15.0 g/dL 13.5  Hematocrit 36.0 - 46.0 % 41.0  Platelets 150.0 - 400.0 K/uL 244.0    Lab Results  Component Value Date/Time   VD25OH 55.51 09/06/2019 09:21 AM    Clinical ASCVD: No  The 10-year ASCVD risk score Mikey Bussing DC Jr., et al., 2013) is: 18.3%   Values used to calculate the score:     Age: 66 years     Sex: Female     Is Non-Hispanic African American: No     Diabetic: Yes     Tobacco smoker: No     Systolic Blood Pressure: 628 mmHg     Is BP treated: Yes     HDL Cholesterol: 44.6 mg/dL     Total Cholesterol: 144 mg/dL     Depression screen [facility] 2/9 10/16/2020 09/13/2019 09/06/2019  Decreased Interest 0 0 0  Down, Depressed, Hopeless 0 0 0  PHQ - 2 Score 0 0 0  Altered sleeping 0 0 0  Tired, decreased energy 0 - 0  Change in appetite 0 - 0  Feeling bad or failure about yourself  0 - 0  Trouble concentrating 0 - 0  Moving slowly or fidgety/restless 0 - 0  Suicidal thoughts 0 - 0  PHQ-9 Score 0 0 0  Difficult doing work/chores Not difficult at all - Not difficult at all    Social History   Tobacco Use  Smoking Status Never Smoker  Smokeless Tobacco Never Used   BP Readings from Last 3 Encounters:  10/20/20 123/61  09/11/20 110/64  03/23/20 133/62   Pulse Readings from Last 3 Encounters:  10/20/20 (!) 52  09/11/20 (!) 53  09/13/19 78   Wt Readings from Last 3 Encounters:  10/20/20 184 lb 4 oz (83.6 kg)  09/11/20 182 lb 8 oz (82.8 kg)  03/23/20 178 lb (80.7 kg)   BMI Readings from Last 3 Encounters:  10/20/20 29.29 kg/m  09/11/20 29.01 kg/m  03/23/20 28.51 kg/m    Assessment/Interventions: Review of patient past medical history, allergies, medications, health status, including review of consultants reports, laboratory and other test data, was performed as part of comprehensive evaluation and provision of chronic care management services.   SDOH:  (Social Determinants of Health) assessments and interventions performed: Yes SDOH Interventions   Flowsheet Row Most Recent Value  SDOH Interventions   Financial Strain Interventions Intervention Not Indicated  [Meds affordable]     SDOH Screenings   Alcohol Screen: Low Risk   . Last Alcohol Screening Score (AUDIT): 0  Depression (PHQ2-9): Low Risk   . PHQ-2 Score: 0  Financial Resource Strain: Low Risk   . Difficulty of Paying Living Expenses: Not hard at all  Food Insecurity: No Food Insecurity  . Worried About Charity fundraiser in the Last Year: Never true  . Ran Out of Food in the Last Year: Never true  Housing: Low Risk   .  Last Housing Risk Score: 0  Physical Activity: Inactive  . Days of Exercise per Week: 0 days  . Minutes of Exercise per Session: 0 min  Social Connections: Not on file  Stress: No Stress Concern Present  . Feeling of Stress : Not at all  Tobacco Use: Low Risk   . Smoking Tobacco Use: Never Smoker  . Smokeless Tobacco Use: Never Used  Transportation Needs: No Transportation Needs  .  Lack of Transportation (Medical): No  . Lack of Transportation (Non-Medical): No    CCM Care Plan  No Known Allergies  Medications Reviewed Today    Reviewed by Debbora Dus, [facility] (Pharmacist) on 11/26/20 at 0855  Med List Status: <None>  Medication Order Taking? Sig Documenting Provider Last Dose Status Informant  Cholecalciferol (VITAMIN D3) 125 MCG (5000 UT) CAPS [id number] Yes Take 1 capsule by mouth daily. [provider] Taking Active   levothyroxine (SYNTHROID) 88 MCG tablet [id number] Yes TAKE 1 TAB BY MOUTH 1XDAY ON AN EMPTY STOMACH W/GLASS OF WATER AT LEAST 30-60 MINS BEFORE BREAKFAST Bedsole, Amy E, MD Taking Active   lisinopril (ZESTRIL) 10 MG tablet [id number] Yes TAKE 1 TABLET BY MOUTH EVERY DAY Elby Beck, FNP Taking Active   metFORMIN (GLUCOPHAGE-XR) 500 MG 24 hr tablet [id number] Yes TAKE 1 TABLET BY MOUTH EVERY DAY WITH DINNER Elby Beck, FNP Taking Active   Omega-3 Fatty Acids (FISH OIL) 1000 MG CAPS [id number] Yes Take 1 capsule by mouth daily. [provider] Taking Active   rosuvastatin (CRESTOR) 20 MG tablet [id number] Yes TAKE 1 TABLET (20 MG TOTAL) BY MOUTH DAILY. STOP LOVASTATIN. Elby Beck, FNP Taking Active           Patient Active Problem List   Diagnosis Date Noted  . Rectal prolapse 09/13/2019  . Hyperlipidemia associated with type 2 diabetes mellitus ([place]) 12/04/2015  . Controlled type 2 diabetes mellitus without complication, without long-term current use of insulin ([place]) 09/04/2015  . Hypertension associated with diabetes ([place])  02/12/2014  . Hypothyroidism 02/12/2014    Immunization History  Administered Date(s) Administered  . Fluad Quad(high Dose 65+) 05/13/2019  . Influenza, High Dose Seasonal PF 05/23/2017, 05/11/2018, 05/11/2020  . Moderna Sars-Covid-2 Vaccination 12/30/2019, 01/27/2020, 08/11/2020  . Pneumococcal Conjugate-13 03/09/2017  . Pneumococcal Polysaccharide-23 03/08/2016  . Tdap 03/02/2020  . Zoster 05/31/2013  . Zoster Recombinat (Shingrix) 03/02/2020, 07/14/2020    Conditions to be addressed/monitored:  Hypertension, Hyperlipidemia, Diabetes and Hypothyroidism  Care Plan : [place]  Updates made by Debbora Dus, [place] since 11/26/2020 12:00 AM    Problem: CHL AMB "PATIENT-SPECIFIC PROBLEM"     Long-Range Goal: Disease Management   Start Date: 11/26/2020  Priority: High  Note:     Current Barriers:  . None identified  Pharmacist Clinical Goal(s):  Marland Kitchen Patient will contact provider office for questions/concerns as evidenced notation of same in electronic health record through collaboration with PharmD and provider.   Interventions: . 1:1 collaboration with Jinny Sanders, MD regarding development and update of comprehensive plan of care as evidenced by provider attestation and co-signature . Inter-disciplinary care team collaboration (see longitudinal plan of care) . Comprehensive medication review performed; medication list updated in electronic medical record  Hypertension (BP goal <130/80) -Controlled - Clinic BP within goal  -Current treatment: . Lisinopril 10 mg - 1 tablet daily at supper -Medications previously tried: none -Pt reports started ACE primarily for kidney protection -Current home readings: none, does not monitor  -Current dietary habits: loosely keto, limits carbs, uses stevia when baking, almond flour; loves green beans and salads, eats three meals each day  -Current exercise habits: none formal, but stays busy - walks long driveway, pulls weeds,  chores -Denies hypotensive/hypertensive symptoms -Sleeps well, rested in the morning. -Educated on Symptoms of hypotension and importance of maintaining adequate hydration; -Recommended to continue current medication  Hyperlipidemia: (LDL goal < 100) -Controlled - LDL 83 -Current treatment: . Rosuvastatin  20 mg - 1 tablet daily at supper . Fish oil 1200 mg - 1 capsule daily  -Medications previously tried: lovastatin  -Educated on Cholesterol goals;  -Recommended to continue current medication  Diabetes (A1c goal <7%) -Controlled - A1c 6.2% -Current medications: Marland Kitchen Metformin 500 mg ER - 1 tablet at dinner -Medications previously tried: none  -Weight loss of 40 lbs over past 5-6 years since DM diagnosis, maintaining current weight around 185 lbs  -Current home glucose readings - checks every morning . fasting glucose: 100-116s . post prandial glucose: none -Denies hypoglycemic/hyperglycemic symptoms -Denies any lows < 70 -Educated on A1c and blood sugar goals;  -She enjoys checking BG daily to keep a check on her diet.  -Counseled to check feet daily and get yearly eye exams - up to date  -Recommended to continue current medication  Hypothyroidism (TSH, T4 WNL) Query Controlled - TSH low 10/13/20 -Current medications:  Levothyroxine 88 mcg - 1 tablet empty stomach before breakfast -Pt confirms taking before breakfast daily -Last TSH below normal limits. Pt does not appear to have any symptoms of over-treatment and has been stable on current dose for 5+ years. PCP aware and continued current dose for now. -Recommended to continue current medication - consider re-eval TSH at next OV Sept 2022.   OTCs: Vitamin D3 5000 IU daily - within normal limits 2021 Denies other OTCs  Patient Goals/Self-Care Activities . Patient will:   - continue medication prescribed  - continue low carb diet and active lifestyle - call CCM pharmacist with any health concerns   Follow Up Plan:  Telephone follow up appointment with care management team member scheduled for: 12 months       Medication Assistance: None required.  Patient affirms current coverage meets needs.  Star Rating Drugs: Medication:                Last Fill:         Day Supply Lisinopril 10 mg           09/06/20            90 Metformin 500 mg       10/01/20            90 Rosuvastatin 20 mg    11/06/20            90  Patient's preferred pharmacy is:  [id number]: [phone]: [phone] Uses pill box? Yes   Pt endorses 95% compliance - missed dose about once/month due to busy day  We discussed: Benefits of medication synchronization, packaging and delivery as well as enhanced pharmacist oversight with [employer]. Patient decided to: Continue current medication management strategy  Care Plan and Follow Up Patient Decision:  Patient agrees to Care Plan and Follow-up.  MDebbora Dus PharmD Clinical Pharmacist [phone]

## 2020-11-26 NOTE — Progress Notes (Signed)
Encounter details: CCM Time Spent       Value Time User   Time spent with patient (minutes)  50 11/26/2020  9:37 AM Debbora Dus, [facility]   Time spent performing Chart review  30 11/26/2020  9:37 AM Debbora Dus, [facility]   Total time (minutes)  80 11/26/2020  9:37 AM Debbora Dus, RPH      Moderate to High Complex Decision Making       Value Time User   Moderate to High complex decision making  Yes 11/26/2020  9:37 AM Debbora Dus, [facility]      CCM Services: This encounter meets complex CCM services and moderate to high decision making.  Prior to outreach and patient consent for Chronic Care Management, I referred this patient for services after reviewing the nominated patient list or from a personal encounter with the patient.  I have personally reviewed this encounter including the documentation in this note and have collaborated with the care management provider regarding care management and care coordination activities to include development and update of the comprehensive care plan. I am certifying that I agree with the content of this note and encounter as supervising physician.

## 2021-02-03 ENCOUNTER — Other Ambulatory Visit: Payer: Self-pay

## 2021-02-03 DIAGNOSIS — E782 Mixed hyperlipidemia: Secondary | ICD-10-CM

## 2021-02-03 DIAGNOSIS — E119 Type 2 diabetes mellitus without complications: Secondary | ICD-10-CM

## 2021-02-03 MED ORDER — ROSUVASTATIN CALCIUM 20 MG PO TABS: 20.0000 mg | ORAL_TABLET | Freq: Every day | ORAL | 0 refills | Status: DC

## 2021-02-17 DIAGNOSIS — B9689 Other specified bacterial agents as the cause of diseases classified elsewhere: Secondary | ICD-10-CM | POA: Diagnosis not present

## 2021-02-17 DIAGNOSIS — J019 Acute sinusitis, unspecified: Secondary | ICD-10-CM | POA: Diagnosis not present

## 2021-02-17 DIAGNOSIS — J101 Influenza due to other identified influenza virus with other respiratory manifestations: Secondary | ICD-10-CM | POA: Diagnosis not present

## 2021-02-17 DIAGNOSIS — Z209 Contact with and (suspected) exposure to unspecified communicable disease: Secondary | ICD-10-CM | POA: Diagnosis not present

## 2021-02-17 DIAGNOSIS — J209 Acute bronchitis, unspecified: Secondary | ICD-10-CM | POA: Diagnosis not present

## 2021-02-17 DIAGNOSIS — J029 Acute pharyngitis, unspecified: Secondary | ICD-10-CM | POA: Diagnosis not present

## 2021-03-02 ENCOUNTER — Telehealth: Payer: Self-pay

## 2021-03-02 NOTE — Chronic Care Management (AMB) (Addendum)
Chronic Care Management Pharmacy Assistant   Name: Andrea Browning  MRN: [id number] DOB: [birth year]  Reason for Encounter: Diabetes   Recent office visits:  None since last CCM contact  Recent consult visits:  None since last CCM contact  Hospital visits:  None in previous 6 months  Medications: Outpatient Encounter Medications as of 03/02/2021  Medication Sig   Cholecalciferol (VITAMIN D3) 125 MCG (5000 UT) CAPS Take 1 capsule by mouth daily.   levothyroxine (SYNTHROID) 88 MCG tablet TAKE 1 TAB BY MOUTH 1XDAY ON AN EMPTY STOMACH W/GLASS OF WATER AT LEAST 30-60 MINS BEFORE BREAKFAST   lisinopril (ZESTRIL) 10 MG tablet TAKE 1 TABLET BY MOUTH EVERY DAY   metFORMIN (GLUCOPHAGE-XR) 500 MG 24 hr tablet TAKE 1 TABLET BY MOUTH EVERY DAY WITH DINNER   Omega-3 Fatty Acids (FISH OIL) 1000 MG CAPS Take 1 capsule by mouth daily.   rosuvastatin (CRESTOR) 20 MG tablet Take 1 tablet (20 mg total) by mouth daily. Stop lovastatin.   No facility-administered encounter medications on file as of 03/02/2021.     Recent Relevant Labs: Lab Results  Component Value Date/Time   HGBA1C 6.2 (A) 09/11/2020 08:48 AM   HGBA1C 7.2 (H) 09/06/2019 09:21 AM   HGBA1C 6.8 (H) 02/18/2019 10:18 AM   MICROALBUR 3.7 (H) 09/06/2019 09:21 AM    Kidney Function Lab Results  Component Value Date/Time   CREATININE 0.78 10/13/2020 08:29 AM   CREATININE 0.92 02/18/2019 10:18 AM   GFR 77.47 10/13/2020 08:29 AM    Contacted patient on 03/08/2021 to discuss diabetes disease state.   Current antihyperglycemic regimen:  Metformin 500 mg ER - 1 tablet at dinner    Patient verbally confirms she is taking the above medications as directed. Yes the patient reports she is doing fine on this medication with no side effects  What diet changes have been made to improve diabetes control? she watches what she eats and tries to stay away from carbohydrates  What recent interventions/DTPs have been made to improve  glycemic control:  None identified at this time   Have there been any recent hospitalizations or ED visits since last visit with CPP? No  Patient denies hypoglycemic symptoms, including Pale, Sweaty, Shaky, Hungry, Nervous/irritable, and Vision changes  Patient denies hyperglycemic symptoms, including blurry vision, excessive thirst, fatigue, polyuria, and weakness  How often are you checking your blood sugar? in the morning before eating or drinking  What are your blood sugars ranging?  Fasting:  03/03/21- 106     03/05/21- 131   (patient reports she ate potato salad the night before)     03/08/21- 115  During the week, how often does your blood glucose drop below 70? Never  Are you checking your feet daily/regularly? Yes  Adherence Review: Is the patient currently on a STATIN medication? Yes Is the patient currently on ACE/ARB medication? Yes Does the patient have >5 day gap between last estimated fill dates? No  Care Gaps: Last annual wellness visit: 10/20/20 Last eye exam / retinopathy screening: 09/09/20 Last diabetic foot exam: 09/11/20  Counseled patient on importance of annual eye and foot exam and she has been compliant with all visits.  Star Rating Drugs:  Medication:  Last Fill: Day Supply Metformin XR 500mg   01/03/21 90 Lisinopril 10mg  12/07/20 90 Rosuvastatin 20mg  02/03/21 90  PCP appointment on 04/23/21  Debbora Dus, CPP notified  Avel Sensor, [place] Assistant [phone]  I have reviewed the care management and care coordination  activities outlined in this encounter and I am certifying that I agree with the content of this note. No further action required.  Debbora Dus, PharmD Clinical Pharmacist [place] [facility] at [facility] [phone]

## 2021-03-08 ENCOUNTER — Other Ambulatory Visit: Payer: Self-pay

## 2021-03-08 MED ORDER — LISINOPRIL 10 MG PO TABS: 10.0000 mg | ORAL_TABLET | Freq: Every day | ORAL | 1 refills | Status: DC

## 2021-03-08 NOTE — Telephone Encounter (Signed)
LAST OV - 11/26/2020 NEXT OV - 04/16/2021 LAST FILLED - 03/02/2021

## 2021-04-05 ENCOUNTER — Other Ambulatory Visit: Payer: Self-pay | Admitting: *Deleted

## 2021-04-05 DIAGNOSIS — E119 Type 2 diabetes mellitus without complications: Secondary | ICD-10-CM

## 2021-04-05 MED ORDER — METFORMIN HCL ER 500 MG PO TB24: ORAL_TABLET | ORAL | 0 refills | Status: DC

## 2021-04-15 ENCOUNTER — Telehealth: Payer: Self-pay | Admitting: Family Medicine

## 2021-04-15 DIAGNOSIS — E119 Type 2 diabetes mellitus without complications: Secondary | ICD-10-CM

## 2021-04-15 NOTE — Telephone Encounter (Signed)
-----   Message from Cloyd Stagers, RT sent at 03/29/2021  3:12 PM EDT ----- Regarding: Lab Orders for Friday 9.2.2022 Please place lab orders for Friday 9.2.2022, office visit for DM f/u on Friday 9.9.2022 Thank you, Dyke Maes RT(R)

## 2021-04-16 ENCOUNTER — Other Ambulatory Visit: Payer: Self-pay

## 2021-04-16 ENCOUNTER — Other Ambulatory Visit (INDEPENDENT_AMBULATORY_CARE_PROVIDER_SITE_OTHER): Payer: PPO

## 2021-04-16 DIAGNOSIS — E119 Type 2 diabetes mellitus without complications: Secondary | ICD-10-CM

## 2021-04-16 LAB — LIPID PANEL
Cholesterol: 127 mg/dL (ref 0–200)
HDL: 40.4 mg/dL (ref >39.00)
LDL Cholesterol: 70 mg/dL (ref 0–99)
NonHDL: 86.19
Total CHOL/HDL Ratio: 3
Triglycerides: 80 mg/dL (ref 0.0–149.0)
VLDL: 16 mg/dL (ref 0.0–40.0)

## 2021-04-16 LAB — COMPREHENSIVE METABOLIC PANEL
ALT: 15 U/L (ref 0–35)
AST: 15 U/L (ref 0–37)
Albumin: 4.2 g/dL (ref 3.5–5.2)
Alkaline Phosphatase: 49 U/L (ref 39–117)
BUN: 21 mg/dL (ref 6–23)
CO2: 31 mEq/L (ref 19–32)
Calcium: 9.6 mg/dL (ref 8.4–10.5)
Chloride: 106 mEq/L (ref 96–112)
Creatinine, Ser: 0.81 mg/dL (ref 0.40–1.20)
GFR: 73.78 mL/min (ref >60.00)
Glucose, Bld: 111 mg/dL — ABNORMAL HIGH (ref 70–99)
Potassium: 4.3 mEq/L (ref 3.5–5.1)
Sodium: 141 mEq/L (ref 135–145)
Total Bilirubin: 0.8 mg/dL (ref 0.2–1.2)
Total Protein: 6.7 g/dL (ref 6.0–8.3)

## 2021-04-16 LAB — HEMOGLOBIN A1C: Hgb A1c MFr Bld: 7 % — ABNORMAL HIGH (ref 4.6–6.5)

## 2021-04-16 NOTE — Progress Notes (Signed)
No critical labs need to be addressed urgently. We will discuss labs in detail at upcoming office visit.   

## 2021-04-22 ENCOUNTER — Other Ambulatory Visit: Payer: Self-pay

## 2021-04-22 ENCOUNTER — Ambulatory Visit (INDEPENDENT_AMBULATORY_CARE_PROVIDER_SITE_OTHER): Payer: PPO | Admitting: Family Medicine

## 2021-04-22 VITALS — BP 108/64 | HR 56 | Temp 98.1°F | Wt 180.5 lb

## 2021-04-22 DIAGNOSIS — E119 Type 2 diabetes mellitus without complications: Secondary | ICD-10-CM | POA: Diagnosis not present

## 2021-04-22 DIAGNOSIS — I152 Hypertension secondary to endocrine disorders: Secondary | ICD-10-CM | POA: Diagnosis not present

## 2021-04-22 DIAGNOSIS — E785 Hyperlipidemia, unspecified: Secondary | ICD-10-CM

## 2021-04-22 DIAGNOSIS — L255 Unspecified contact dermatitis due to plants, except food: Secondary | ICD-10-CM | POA: Insufficient documentation

## 2021-04-22 DIAGNOSIS — E1159 Type 2 diabetes mellitus with other circulatory complications: Secondary | ICD-10-CM

## 2021-04-22 DIAGNOSIS — E1169 Type 2 diabetes mellitus with other specified complication: Secondary | ICD-10-CM | POA: Diagnosis not present

## 2021-04-22 MED ORDER — BETAMETHASONE DIPROPIONATE 0.05 % EX CREA: TOPICAL_CREAM | Freq: Two times a day (BID) | CUTANEOUS | 0 refills | Status: DC

## 2021-04-22 NOTE — Assessment & Plan Note (Signed)
Use betamethasone prn.

## 2021-04-22 NOTE — Progress Notes (Signed)
Patient ID: Andrea Browning, female    DOB: [birth year], 70 y.o.   MRN: [id number]  This visit was conducted in person.  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 No current reaction, just wants cream on hand and has run out.   Will get flu shot at [facility] with husband.     Subjective:   HPI: Andrea Browning is a 70 y.o. 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 No current reaction, just wants cream on hand and has run out. //Will get flu shot at [facility] with husband. )  1.Diabetes:  tolerable control on metformin Xr 500 mg daily   She was on prednisone in early August.. CBGs were 200s Lab Results  Component Value Date   HGBA1C 7.0 (H) 04/16/2021  Using medications without difficulties: none Hypoglycemic episodes: none Hyperglycemic episodes: see above Feet problems: No ulcers Blood Sugars averaging: 100-106 eye exam within last year:yes  2. Hypertension:  At goal on lisinopril   BP Readings from Last 3 Encounters:  04/22/21 108/64  10/20/20 123/61  09/11/20 110/64  Using medication without problems or lightheadedness:  none Chest pain with exertion: none Edema: none Short of breath:none Average home BPs: Other issues: Wt Readings from Last 3 Encounters:  04/22/21 180 lb 8 oz (81.9 kg)  10/20/20 184 lb 4 oz (83.6 kg)  09/11/20 182 lb 8 oz (82.8 kg)    3 High Cholesterol:  LDL at goal  on  crestor 20 mg daily  Lab Results  Component Value Date   CHOL 127 04/16/2021   HDL 40.40 04/16/2021   LDLCALC 70 04/16/2021   TRIG 80.0 04/16/2021   CHOLHDL 3 04/16/2021  Using medications without problems: Muscle aches:  Diet compliance:  moderate Exercise: plans to restart walking Other complaints:   4.. Hx of  severe plant dermatitis, no acute rash  Requests refill of betamethasone      Relevant past medical, surgical, family and social history reviewed and updated as indicated. Interim medical history since our last visit reviewed. Allergies and medications reviewed and updated. Outpatient Medications Prior to Visit  Medication Sig Dispense Refill   Cholecalciferol (VITAMIN D3) 125 MCG (5000 UT) CAPS Take 1 capsule by mouth daily.     levothyroxine (SYNTHROID) 88 MCG tablet TAKE 1 TAB BY MOUTH 1XDAY ON AN EMPTY STOMACH W/GLASS OF WATER AT LEAST 30-60 MINS BEFORE BREAKFAST 90 tablet 3   lisinopril (ZESTRIL) 10 MG tablet Take 1 tablet (10 mg total) by mouth daily. 90 tablet 1   metFORMIN (GLUCOPHAGE-XR) 500 MG 24 hr tablet TAKE 1 TABLET BY MOUTH EVERY DAY WITH DINNER 90 tablet 0   rosuvastatin (CRESTOR) 20 MG tablet Take 1 tablet (20 mg total) by mouth daily. Stop lovastatin. 90 tablet 0   Omega-3 Fatty Acids (FISH OIL) 1000 MG CAPS Take 1 capsule by mouth daily.     No facility-administered medications prior to visit.     Per HPI unless specifically indicated in ROS section below Review of Systems  Constitutional:  Negative for fatigue  and fever.  HENT:  Negative for ear pain.   Eyes:  Negative for pain.  Respiratory:  Negative for chest tightness and shortness of breath.   Cardiovascular:  Negative for chest pain, palpitations and leg swelling.  Gastrointestinal:  Negative for abdominal pain.  Genitourinary:  Negative for dysuria.  Objective:  BP 108/64   Pulse (!) 56   Temp 98.1 F (36.7 C) (Temporal)   Wt 180 lb 8 oz (81.9 kg)   SpO2 100%   BMI 28.70 kg/m   Wt Readings from Last 3 Encounters:  04/22/21 180 lb 8 oz (81.9 kg)  10/20/20 184 lb 4 oz (83.6 kg)  09/11/20 182 lb 8 oz (82.8 kg)      Physical Exam Constitutional:      General: She is not in acute distress.    Appearance: Normal  appearance. She is well-developed. She is not ill-appearing or toxic-appearing.  HENT:     Head: Normocephalic.     Right Ear: Hearing, tympanic membrane, ear canal and external ear normal. Tympanic membrane is not erythematous, retracted or bulging.     Left Ear: Hearing, tympanic membrane, ear canal and external ear normal. Tympanic membrane is not erythematous, retracted or bulging.     Nose: No mucosal edema or rhinorrhea.     Right Sinus: No maxillary sinus tenderness or frontal sinus tenderness.     Left Sinus: No maxillary sinus tenderness or frontal sinus tenderness.     Mouth/Throat:     Pharynx: Uvula midline.  Eyes:     General: Lids are normal. Lids are everted, no foreign bodies appreciated.     Conjunctiva/sclera: Conjunctivae normal.     Pupils: Pupils are equal, round, and reactive to light.  Neck:     Thyroid: No thyroid mass or thyromegaly.     Vascular: No carotid bruit.     Trachea: Trachea normal.  Cardiovascular:     Rate and Rhythm: Normal rate and regular rhythm.     Pulses: Normal pulses.     Heart sounds: Normal heart sounds, S1 normal and S2 normal. No murmur heard.   No friction rub. No gallop.  Pulmonary:     Effort: Pulmonary effort is normal. No tachypnea or respiratory distress.     Breath sounds: Normal breath sounds. No decreased breath sounds, wheezing, rhonchi or rales.  Abdominal:     General: Bowel sounds are normal.     Palpations: Abdomen is soft.     Tenderness: There is no abdominal tenderness.  Musculoskeletal:     Cervical back: Normal range of motion and neck supple.  Skin:    General: Skin is warm and dry.     Findings: No rash.  Neurological:     Mental Status: She is alert.  Psychiatric:        Mood and Affect: Mood is not anxious or depressed.        Speech: Speech normal.        Behavior: Behavior normal. Behavior is cooperative.        Thought Content: Thought content normal.        Judgment: Judgment normal.      Results  for orders placed or performed in visit on 04/16/21  Comprehensive metabolic panel  Result Value Ref Range   Sodium 141 135 - 145 mEq/L   Potassium 4.3 3.5 - 5.1 mEq/L   Chloride 106 96 - 112 mEq/L   CO2 31 19 - 32 mEq/L   Glucose, Bld 111 (  H) 70 - 99 mg/dL   BUN 21 6 - 23 mg/dL   Creatinine, Ser 0.81 0.40 - 1.20 mg/dL   Total Bilirubin 0.8 0.2 - 1.2 mg/dL   Alkaline Phosphatase 49 39 - 117 U/L   AST 15 0 - 37 U/L   ALT 15 0 - 35 U/L   Total Protein 6.7 6.0 - 8.3 g/dL   Albumin 4.2 3.5 - 5.2 g/dL   GFR 73.78 >60.00 mL/min   Calcium 9.6 8.4 - 10.5 mg/dL  Lipid panel  Result Value Ref Range   Cholesterol 127 0 - 200 mg/dL   Triglycerides 80.0 0.0 - 149.0 mg/dL   HDL 40.40 >39.00 mg/dL   VLDL 16.0 0.0 - 40.0 mg/dL   LDL Cholesterol 70 0 - 99 mg/dL   Total CHOL/HDL Ratio 3    NonHDL 86.19   Hemoglobin A1c  Result Value Ref Range   Hgb A1c MFr Bld 7.0 (H) 4.6 - 6.5 %    This visit occurred during the SARS-CoV-2 public health emergency.  Safety protocols were in place, including screening questions prior to the visit, additional usage of staff PPE, and extensive cleaning of exam room while observing appropriate contact time as indicated for disinfecting solutions.   COVID 19 screen:  No recent travel or known exposure to COVID19 The patient denies respiratory symptoms of COVID 19 at this time. The importance of social distancing was discussed today.   Assessment and Plan Problem List Items Addressed This Visit     Controlled type 2 diabetes mellitus without complication, without long-term current use of insulin (HCC) - Primary (Chronic)    Stable, chronic.  Continue current medication. Slightly worse this visit given inactivity with back issues as well as prednisone course.  Now back at goal FBS with return to walking and off prednisone.   metformin Xr 500 mg daily       Hyperlipidemia associated with type 2 diabetes mellitus (HCC) (Chronic)    Stable, chronic.  Continue  current medication.   Crestor 20 mg daily      Hypertension associated with diabetes (HCC) (Chronic)    Stable, chronic.  Continue current medication.   Lisinopril  10 mg dialy       History of Contact dermatitis due to plant    Use betamethasone prn.      Meds ordered this encounter  Medications   betamethasone dipropionate 0.05 % cream    Sig: Apply topically 2 (two) times daily.    Dispense:  30 g    Refill:  0       Eliezer Lofts, MD

## 2021-04-22 NOTE — Assessment & Plan Note (Signed)
Stable, chronic.  Continue current medication.   Lisinopril  10 mg dialy

## 2021-04-22 NOTE — Assessment & Plan Note (Signed)
Stable, chronic.  Continue current medication. Slightly worse this visit given inactivity with back issues as well as prednisone course.  Now back at goal FBS with return to walking and off prednisone.   metformin Xr 500 mg daily

## 2021-04-22 NOTE — Patient Instructions (Addendum)
Plan on restarting walking regimen as discussed and keep up with  heart healthy low carb diet.  Make sure to get a flu shot.

## 2021-04-22 NOTE — Assessment & Plan Note (Signed)
Stable, chronic.  Continue current medication.  Crestor 20 mg daily 

## 2021-04-23 ENCOUNTER — Ambulatory Visit: Payer: PPO | Admitting: Family Medicine

## 2021-05-14 ENCOUNTER — Other Ambulatory Visit: Payer: Self-pay | Admitting: Family Medicine

## 2021-05-14 DIAGNOSIS — E119 Type 2 diabetes mellitus without complications: Secondary | ICD-10-CM

## 2021-05-14 DIAGNOSIS — E782 Mixed hyperlipidemia: Secondary | ICD-10-CM

## 2021-05-21 ENCOUNTER — Other Ambulatory Visit: Payer: Self-pay | Admitting: Family Medicine

## 2021-05-21 DIAGNOSIS — Z1231 Encounter for screening mammogram for malignant neoplasm of breast: Secondary | ICD-10-CM

## 2021-06-09 ENCOUNTER — Ambulatory Visit
Admission: RE | Admit: 2021-06-09 | Discharge: 2021-06-09 | Disposition: A | Discharge status: Home / Self Care | Payer: PPO | Source: Ambulatory Visit | Attending: Family Medicine | Admitting: Family Medicine

## 2021-06-09 ENCOUNTER — Other Ambulatory Visit: Payer: Self-pay

## 2021-06-09 DIAGNOSIS — Z1231 Encounter for screening mammogram for malignant neoplasm of breast: Secondary | ICD-10-CM | POA: Insufficient documentation

## 2021-06-21 ENCOUNTER — Telehealth: Payer: Self-pay

## 2021-06-21 NOTE — Chronic Care Management (AMB) (Addendum)
Chronic Care Management Pharmacy Assistant   Name: Andrea Browning  MRN: [id number] DOB: [birth year]   Reason for Encounter: Diabetes Disease State   Recent office visits:  04/22/21 - PCP  -Patient presented for follow up diabetes and contact dermatitis. START betamethasone 0.05% cream. A1c increased, plan on restarting walking regimen as discussed and heart healthy low carb diet. Make sure to get a flu shot.  Recent consult visits:  None since last CCM visit  Hospital visits:  None in previous 6 months  Medications: Outpatient Encounter Medications as of 06/21/2021  Medication Sig   betamethasone dipropionate 0.05 % cream Apply topically 2 (two) times daily.   Cholecalciferol (VITAMIN D3) 125 MCG (5000 UT) CAPS Take 1 capsule by mouth daily.   levothyroxine (SYNTHROID) 88 MCG tablet TAKE 1 TAB BY MOUTH 1XDAY ON AN EMPTY STOMACH W/GLASS OF WATER AT LEAST 30-60 MINS BEFORE BREAKFAST   lisinopril (ZESTRIL) 10 MG tablet Take 1 tablet (10 mg total) by mouth daily.   metFORMIN (GLUCOPHAGE-XR) 500 MG 24 hr tablet TAKE 1 TABLET BY MOUTH EVERY DAY WITH DINNER   rosuvastatin (CRESTOR) 20 MG tablet TAKE 1 TABLET (20 MG TOTAL) BY MOUTH DAILY. STOP LOVASTATIN.   No facility-administered encounter medications on file as of 06/21/2021.     Recent Relevant Labs: Lab Results  Component Value Date/Time   HGBA1C 7.0 (H) 04/16/2021 07:54 AM   HGBA1C 6.2 (A) 09/11/2020 08:48 AM   HGBA1C 7.2 (H) 09/06/2019 09:21 AM   MICROALBUR 3.7 (H) 09/06/2019 09:21 AM    Kidney Function Lab Results  Component Value Date/Time   CREATININE 0.81 04/16/2021 07:54 AM   CREATININE 0.78 10/13/2020 08:29 AM   GFR 73.78 04/16/2021 07:54 AM     Contacted patient on 06/21/21 to discuss diabetes disease state.   Current antihyperglycemic regimen:   Metformin XR 500mg - take 1 tablet at dinner    Patient verbally confirms she is taking the above medications as directed. Yes  What diet changes have been made  to improve diabetes control? The patient reports she watches her carbohydrate intake, limits her sweets.  What recent interventions/DTPs have been made to improve glycemic control: The patient got off prednisone after having the flu.  Have there been any recent hospitalizations or ED visits since last visit with CPP? No  Patient denies hypoglycemic symptoms, including Pale, Sweaty, Shaky, Hungry, Nervous/irritable, and Vision changes  Patient denies hyperglycemic symptoms, including blurry vision, excessive thirst, fatigue, polyuria, and weakness  How often are you checking your blood sugar? once daily  What are your blood sugars ranging?  Fasting: 06/21/21- 107    06/20/21- 116   During the week, how often does your blood glucose drop below 70? Never  Are you checking your feet daily/regularly? Yes  Adherence Review: Is the patient currently on a STATIN medication? Yes Is the patient currently on ACE/ARB medication? Yes Does the patient have >5 day gap between last estimated fill dates? No  Care Gaps: Annual wellness visit in last year? Yes Most recent A1C reading:7.0  04/16/21           (Patient stated she had the flu- on prednisone) Most Recent BP reading:  108/64 56-P  04/22/21  Last eye exam / retinopathy screening: 09/09/20 Last diabetic foot exam: 09/11/20  Counseled patient on importance of annual eye and foot exam. Up to date.  Star Rating Drugs:  Medication:  Last Fill: Day Supply Metformin XR 500mg     04/05/21  90 Lisinopril 10mg             06/05/21              90 Rosuvastatin 20mg        05/17/21               90   No appointments scheduled within the next 30 days.  Debbora Dus, CPP notified  Avel Sensor, [place] Assistant [phone] I have reviewed the care management and care coordination activities outlined in this encounter and I am certifying that I agree with the content of this note. No further action required.  Debbora Dus, PharmD Clinical Pharmacist [facility] [phone]

## 2021-07-01 ENCOUNTER — Other Ambulatory Visit: Payer: Self-pay | Admitting: Family Medicine

## 2021-07-01 DIAGNOSIS — E119 Type 2 diabetes mellitus without complications: Secondary | ICD-10-CM

## 2021-08-20 DIAGNOSIS — J019 Acute sinusitis, unspecified: Secondary | ICD-10-CM | POA: Diagnosis not present

## 2021-08-20 DIAGNOSIS — Z03818 Encounter for observation for suspected exposure to other biological agents ruled out: Secondary | ICD-10-CM | POA: Diagnosis not present

## 2021-08-28 ENCOUNTER — Other Ambulatory Visit: Payer: Self-pay | Admitting: Family Medicine

## 2021-09-10 ENCOUNTER — Other Ambulatory Visit: Payer: Self-pay | Admitting: Family Medicine

## 2021-09-14 DIAGNOSIS — H2513 Age-related nuclear cataract, bilateral: Secondary | ICD-10-CM | POA: Diagnosis not present

## 2021-09-14 LAB — HM DIABETES EYE EXAM

## 2021-10-06 ENCOUNTER — Telehealth: Payer: Self-pay | Admitting: Family Medicine

## 2021-10-06 DIAGNOSIS — E039 Hypothyroidism, unspecified: Secondary | ICD-10-CM

## 2021-10-06 DIAGNOSIS — E119 Type 2 diabetes mellitus without complications: Secondary | ICD-10-CM

## 2021-10-06 NOTE — Telephone Encounter (Signed)
-----   Message from Ellamae Sia sent at 10/04/2021  4:15 PM EST ----- Regarding: lab orders for Friday, 3.3.23 Patient is scheduled for CPX labs, please order future labs, Thanks , Karna Christmas

## 2021-10-08 ENCOUNTER — Other Ambulatory Visit: Payer: Self-pay

## 2021-10-12 ENCOUNTER — Encounter: Payer: Self-pay | Admitting: Family Medicine

## 2021-10-15 ENCOUNTER — Other Ambulatory Visit (INDEPENDENT_AMBULATORY_CARE_PROVIDER_SITE_OTHER): Payer: PPO

## 2021-10-15 ENCOUNTER — Other Ambulatory Visit: Payer: Self-pay

## 2021-10-15 DIAGNOSIS — E119 Type 2 diabetes mellitus without complications: Secondary | ICD-10-CM

## 2021-10-15 DIAGNOSIS — E039 Hypothyroidism, unspecified: Secondary | ICD-10-CM | POA: Diagnosis not present

## 2021-10-15 LAB — LIPID PANEL
Cholesterol: 156 mg/dL (ref 0–200)
HDL: 44.4 mg/dL (ref >39.00)
LDL Cholesterol: 95 mg/dL (ref 0–99)
NonHDL: 111.57
Total CHOL/HDL Ratio: 4
Triglycerides: 84 mg/dL (ref 0.0–149.0)
VLDL: 16.8 mg/dL (ref 0.0–40.0)

## 2021-10-15 LAB — COMPREHENSIVE METABOLIC PANEL
ALT: 13 U/L (ref 0–35)
AST: 14 U/L (ref 0–37)
Albumin: 4.2 g/dL (ref 3.5–5.2)
Alkaline Phosphatase: 53 U/L (ref 39–117)
BUN: 23 mg/dL (ref 6–23)
CO2: 30 mEq/L (ref 19–32)
Calcium: 9.3 mg/dL (ref 8.4–10.5)
Chloride: 105 mEq/L (ref 96–112)
Creatinine, Ser: 0.82 mg/dL (ref 0.40–1.20)
GFR: 72.44 mL/min (ref >60.00)
Glucose, Bld: 120 mg/dL — ABNORMAL HIGH (ref 70–99)
Potassium: 4.1 mEq/L (ref 3.5–5.1)
Sodium: 142 mEq/L (ref 135–145)
Total Bilirubin: 0.7 mg/dL (ref 0.2–1.2)
Total Protein: 6.4 g/dL (ref 6.0–8.3)

## 2021-10-15 LAB — T4, FREE: Free T4: 0.92 ng/dL (ref 0.60–1.60)

## 2021-10-15 LAB — TSH: TSH: 0.4 u[IU]/mL (ref 0.35–5.50)

## 2021-10-15 LAB — T3, FREE: T3, Free: 3 pg/mL (ref 2.3–4.2)

## 2021-10-15 LAB — HEMOGLOBIN A1C: Hgb A1c MFr Bld: 7 % — ABNORMAL HIGH (ref 4.6–6.5)

## 2021-10-15 NOTE — Progress Notes (Signed)
Subjective:   Andrea Browning is a 71 y.o. female who presents for Medicare Annual (Subsequent) preventive examination.  I connected with Andrea Browning today by telephone and verified that I am speaking with the correct person using two identifiers. Location patient: home Location provider: work Persons participating in the virtual visit: patient, Marine scientist.    I discussed the limitations, risks, security and privacy concerns of performing an evaluation and management service by telephone and the availability of in person appointments. I also discussed with the patient that there may be a patient responsible charge related to this service. The patient expressed understanding and verbally consented to this telephonic visit.    Interactive audio and video telecommunications were attempted between this provider and patient, however failed, due to patient having technical difficulties OR patient did not have access to video capability.  We continued and completed visit with audio only.  Some vital signs may be absent or patient reported.   Time Spent with patient on telephone encounter: 20 minutes  Review of Systems     Cardiac Risk Factors include: advanced age (>37men, >61 women);diabetes mellitus;hypertension;dyslipidemia     Objective:    Today's Vitals   10/18/21 0814  Weight: 180 lb (81.6 kg)  Height: 5\' 6"  (1.676 m)   Body mass index is 29.05 kg/m.  Advanced Directives 10/18/2021 10/16/2020 09/06/2019 05/30/2018  Does Patient Have a Medical Advance Directive? No No No No  Does patient want to make changes to medical advance directive? - No - Patient declined - -  Would patient like information on creating a medical advance directive? Yes (MAU/Ambulatory/Procedural Areas - Information given) - No - Patient declined No - Patient declined    Current Medications (verified) Outpatient Encounter Medications as of 10/18/2021  Medication Sig   Cholecalciferol (VITAMIN D3) 125 MCG  (5000 UT) CAPS Take 1 capsule by mouth daily.   levothyroxine (SYNTHROID) 88 MCG tablet TAKE 1 TAB BY MOUTH 1XDAY ON AN EMPTY STOMACH W/GLASS OF WATER AT LEAST 30-60 MINS BEFORE BREAKFAST   lisinopril (ZESTRIL) 10 MG tablet TAKE 1 TABLET BY MOUTH EVERY DAY   metFORMIN (GLUCOPHAGE-XR) 500 MG 24 hr tablet TAKE 1 TABLET BY MOUTH EVERY DAY WITH DINNER   rosuvastatin (CRESTOR) 20 MG tablet TAKE 1 TABLET (20 MG TOTAL) BY MOUTH DAILY. STOP LOVASTATIN.   betamethasone dipropionate 0.05 % cream Apply topically 2 (two) times daily. (Patient not taking: Reported on 10/18/2021)   FLUZONE HIGH-DOSE QUADRIVALENT 0.7 ML SUSY    No facility-administered encounter medications on file as of 10/18/2021.    Allergies (verified) Patient has no known allergies.   History: Past Medical History:  Diagnosis Date   Cancer ([place])    Skin   Diabetes mellitus without complication ([place])    Hyperlipidemia    Hypertension    Thyroid disease    Past Surgical History:  Procedure Laterality Date   COLONOSCOPY WITH PROPOFOL N/A 05/30/2018   Procedure: COLONOSCOPY WITH PROPOFOL;  Surgeon: Manya Silvas, MD;  Location: [facility] ENDOSCOPY;  Service: Endoscopy;  Laterality: N/A;   DILATION AND CURETTAGE OF UTERUS     WISDOM TOOTH EXTRACTION     Family History  Problem Relation Age of Onset   Breast cancer Maternal Aunt 72   Breast cancer Sister 29   Diabetes Sister    Colon cancer Mother    Arthritis Father    Diabetes Maternal Grandmother    Heart disease Brother    Hyperlipidemia Brother    Heart disease  Brother    Hyperlipidemia Brother    Social History   Socioeconomic History   Marital status: Married    Spouse name:  Darryl   Number of children: 2   Years of education: Not on file   Highest education level: Not on file  Occupational History   Not on file  Tobacco Use   Smoking status: Never   Smokeless tobacco: Never  Substance and Sexual Activity   Alcohol use: Never   Drug use: Never   Sexual  activity: Not Currently  Other Topics Concern   Not on file  Social History Narrative   Not on file   Social Determinants of Health   Financial Resource Strain: Low Risk    Difficulty of Paying Living Expenses: Not hard at all  Food Insecurity: No Food Insecurity   Worried About Charity fundraiser in the Last Year: Never true   [place] in the Last Year: Never true  Transportation Needs: No Transportation Needs   Lack of Transportation (Medical): No   Lack of Transportation (Non-Medical): No  Physical Activity: Inactive   Days of Exercise per Week: 0 days   Minutes of Exercise per Session: 0 min  Stress: No Stress Concern Present   Feeling of Stress : Not at all  Social Connections: Socially Integrated   Frequency of Communication with Friends and Family: More than three times a week   Frequency of Social Gatherings with Friends and Family: More than three times a week   Attends Religious Services: More than 4 times per year   Active Member of [employer] or Organizations: Yes   Attends Music therapist: More than 4 times per year   Marital Status: Married    Tobacco Counseling Counseling given: Not Answered   Clinical Intake:  Pre-visit preparation completed: Yes  Pain : No/denies pain     BMI - recorded: 29.05 Nutritional Status: BMI 25 -29 Overweight Nutritional Risks: None Diabetes: Yes CBG done?: No Did pt. bring in CBG monitor from home?: No  How often do you need to have someone help you when you read instructions, pamphlets, or other written materials from your doctor or pharmacy?: 1 - Never  Diabetes:  Is the patient diabetic?  Yes  If diabetic, was a CBG obtained today?  No  Did the patient bring in their glucometer from home?  No  How often do you monitor your CBG's? 1x daily.   Financial Strains and Diabetes Management:  Are you having any financial strains with the device, your supplies or your medication? No .  Does the patient  want to be seen by Chronic Care Management for management of their diabetes?  No  Would the patient like to be referred to a Nutritionist or for Diabetic Management?  No   Diabetic Exams:  Diabetic Eye Exam: Completed 09/14/21.  Diabetic Foot Exam:  Pt has an upcoming appointment with PCP.   Interpreter Needed?: No  Information entered by :: Orrin Brigham LPN   Activities of Daily Living In your present state of health, do you have any difficulty performing the following activities: 10/18/2021  Hearing? N  Vision? N  Difficulty concentrating or making decisions? N  Walking or climbing stairs? N  Dressing or bathing? N  Doing errands, shopping? N  Preparing Food and eating ? N  Using the Toilet? N  In the past six months, have you accidently leaked urine? N  Do you have problems with loss of  bowel control? N  Managing your Medications? N  Managing your Finances? N  Housekeeping or managing your Housekeeping? N  Some recent data might be hidden    Patient Care Team: Jinny Sanders, MD as PCP - General (Family Medicine) Debbora Dus, [facility] as Pharmacist (Pharmacist)  Indicate any recent Medical Services you may have received from other than [facility] providers in the past year (date may be approximate).     Assessment:   This is a routine wellness examination for Caliann.  Hearing/Vision screen Hearing Screening - Comments:: No issues  Vision Screening - Comments:: Last exam 09/14/21, Dr. Thomasene Ripple , wears glasses  Dietary issues and exercise activities discussed: Current Exercise Habits: The patient does not participate in regular exercise at present   Goals Addressed             This Visit's Progress    Patient Stated       Would like to start walking again        Depression Screen PHQ 2/9 Scores 10/18/2021 10/16/2020 09/13/2019 09/06/2019 02/18/2019  PHQ - 2 Score 0 0 0 0 0  PHQ- 9 Score - 0 0 0 0    Fall Risk Fall Risk  10/18/2021 10/16/2020 09/13/2019 09/06/2019  07/10/2019  Falls in the past year? 0 0 0 0 0  Comment - - - - Emmi Telephone Survey: data to providers prior to load  Number falls in past yr: 0 0 - 0 -  Injury with Fall? 0 0 - 0 -  Risk for fall due to : No Fall Risks Medication side effect - Medication side effect -  Follow up Falls prevention discussed Falls evaluation completed;Falls prevention discussed - Falls evaluation completed;Falls prevention discussed -    FALL RISK PREVENTION PERTAINING TO THE HOME:  Any stairs in or around the home? Yes  If so, are there any without handrails? No  Home free of loose throw rugs in walkways, pet beds, electrical cords, etc? Yes  Adequate lighting in your home to reduce risk of falls? Yes   ASSISTIVE DEVICES UTILIZED TO PREVENT FALLS:  Life alert? No  Use of a cane, walker or w/c? No  Grab bars in the bathroom? No  Shower chair or bench in shower? Yes  Elevated toilet seat or a handicapped toilet? No   TIMED UP AND GO:  Was the test performed? No .    Cognitive Function: Normal cognitive status assessed by  this Nurse Health Advisor. No abnormalities found.   MMSE - Mini Mental State Exam 10/16/2020 09/06/2019  Orientation to time 5 5  Orientation to Place 5 5  Registration 3 3  Attention/ Calculation 5 5  Recall 3 3  Language- repeat 1 1        Immunizations Immunization History  Administered Date(s) Administered   Fluad Quad(high Dose 65+) 05/13/2019   Influenza, High Dose Seasonal PF 05/23/2017, 05/11/2018, 05/11/2020   Moderna Sars-Covid-2 Vaccination 12/30/2019, 01/27/2020, 08/11/2020   Pneumococcal Conjugate-13 03/09/2017   Pneumococcal Polysaccharide-23 03/08/2016   Tdap 03/02/2020   Zoster Recombinat (Shingrix) 03/02/2020, 07/14/2020   Zoster, Live 05/31/2013    TDAP status: Up to date  Flu Vaccine status: Up to date  Pneumococcal vaccine status: Up to date  Covid-19 vaccine status: Information provided on how to obtain vaccines.   Qualifies for  Shingles Vaccine? Yes   Zostavax completed Yes   Shingrix Completed?: Yes  Screening Tests Health Maintenance  Topic Date Due   COVID-19 Vaccine (4 - Booster  for Moderna series) 10/06/2020   Pneumonia Vaccine 76+ Years old (3) 03/08/2021   INFLUENZA VACCINE  03/15/2021   FOOT EXAM  08/17/2021   HEMOGLOBIN A1C  10/14/2021   OPHTHALMOLOGY EXAM  09/14/2022   COLONOSCOPY (Pts 45-12yrs Insurance coverage will need to be confirmed)  05/31/2023   MAMMOGRAM  06/10/2023   DEXA SCAN  05/28/2025   TETANUS/TDAP  03/02/2030   Hepatitis C Screening  Completed   Zoster Vaccines- Shingrix  Completed   HPV VACCINES  Aged Out    Health Maintenance  Health Maintenance Due  Topic Date Due   COVID-19 Vaccine (4 - Booster for Moderna series) 10/06/2020   Pneumonia Vaccine 55+ Years old (3) 03/08/2021   INFLUENZA VACCINE  03/15/2021   FOOT EXAM  08/17/2021   HEMOGLOBIN A1C  10/14/2021    Colorectal cancer screening: Type of screening: Colonoscopy. Completed 05/30/18. Repeat every 5 years  Mammogram status: Completed 06/09/21. Repeat every year  Bone Density status: Completed 05/28/20. Results reflect: Bone density results: NORMAL. Repeat every 2 years.  Lung Cancer Screening: (Low Dose CT Chest recommended if Age 43-80 years, 30 pack-year currently smoking OR have quit w/in 15years.) does not qualify.     Additional Screening:  Hepatitis C Screening: does qualify; Completed 09/06/19  Vision Screening: Recommended annual ophthalmology exams for early detection of glaucoma and other disorders of the eye. Is the patient up to date with their annual eye exam?  Yes  Who is the provider or what is the name of the office in which the patient attends annual eye exams? Dr. Thomasene Ripple   Dental Screening: Recommended annual dental exams for proper oral hygiene  Community Resource Referral / Chronic Care Management: CRR required this visit?  No   CCM required this visit?  No      Plan:      I have personally reviewed and noted the following in the patients chart:   Medical and social history Use of alcohol, tobacco or illicit drugs  Current medications and supplements including opioid prescriptions.  Functional ability and status Nutritional status Physical activity Advanced directives List of other physicians Hospitalizations, surgeries, and ER visits in previous 12 months Vitals Screenings to include cognitive, depression, and falls Referrals and appointments  In addition, I have reviewed and discussed with patient certain preventive protocols, quality metrics, and best practice recommendations. A written personalized care plan for preventive services as well as general preventive health recommendations were provided to patient.   Due to this being a telephonic visit, the after visit summary with patients personalized plan was offered to patient via mail or my-chart.  Patient would like to access on my-chart.   Loma Messing, LPN   04/17/2354   Nurse Health Advisor  Nurse Notes: none

## 2021-10-18 ENCOUNTER — Ambulatory Visit (INDEPENDENT_AMBULATORY_CARE_PROVIDER_SITE_OTHER): Payer: PPO

## 2021-10-18 VITALS — Ht 66.0 in | Wt 180.0 lb

## 2021-10-18 DIAGNOSIS — Z0000 Encounter for general adult medical examination without abnormal findings: Secondary | ICD-10-CM | POA: Diagnosis not present

## 2021-10-18 LAB — HM DIABETES FOOT EXAM

## 2021-10-18 NOTE — Patient Instructions (Signed)
Andrea Browning , Thank you for taking time to complete for your Medicare Wellness Visit. I appreciate your ongoing commitment to your health goals. Please review the following plan we discussed and let me know if I can assist you in the future.   Screening recommendations/referrals: Colonoscopy: up to date, completed 05/30/18, due 05/31/23 Mammogram: up to date completed 06/09/21, due 06/09/22 Bone Density: up to date, completed 05/28/20, due 05/28/22 Recommended yearly ophthalmology/optometry visit for glaucoma screening and checkup Recommended yearly dental visit for hygiene and checkup  Vaccinations: Influenza vaccine: up to date  Pneumococcal vaccine: up to date  Tdap vaccine: up to date, completed 03/02/20, due 03/02/30 Shingles vaccine: up to date    Covid-19:newest booster available at your local pharmacy  Advanced directives: information available at your next appointment  Conditions/risks identified: see problem list   Next appointment: Follow up in one year for your annual wellness visit 10/20/22 @ 8:15am, this will be a telephone visit    Preventive Care 59 Years and Older, Female Preventive care refers to lifestyle choices and visits with your health care provider that can promote health and wellness. What does preventive care include? A yearly physical exam. This is also called an annual well check. Dental exams once or twice a year. Routine eye exams. Ask your health care provider how often you should have your eyes checked. Personal lifestyle choices, including: Daily care of your teeth and gums. Regular physical activity. Eating a healthy diet. Avoiding tobacco and drug use. Limiting alcohol use. Practicing safe sex. Taking low-dose aspirin every day. Taking vitamin and mineral supplements as recommended by your health care provider. What happens during an annual well check? The services and screenings done by your health care provider during your annual well check  will depend on your age, overall health, lifestyle risk factors, and family history of disease. Counseling  Your health care provider may ask you questions about your: Alcohol use. Tobacco use. Drug use. Emotional well-being. Home and relationship well-being. Sexual activity. Eating habits. History of falls. Memory and ability to understand (cognition). Work and work Statistician. Reproductive health. Screening  You may have the following tests or measurements: Height, weight, and BMI. Blood pressure. Lipid and cholesterol levels. These may be checked every 5 years, or more frequently if you are over 22 years old. Skin check. Lung cancer screening. You may have this screening every year starting at age 72 if you have a 30-pack-year history of smoking and currently smoke or have quit within the past 15 years. Fecal occult blood test (FOBT) of the stool. You may have this test every year starting at age 78. Flexible sigmoidoscopy or colonoscopy. You may have a sigmoidoscopy every 5 years or a colonoscopy every 10 years starting at age 66. Hepatitis C blood test. Hepatitis B blood test. Sexually transmitted disease (STD) testing. Diabetes screening. This is done by checking your blood sugar (glucose) after you have not eaten for a while (fasting). You may have this done every 1-3 years. Bone density scan. This is done to screen for osteoporosis. You may have this done starting at age 72. Mammogram. This may be done every 1-2 years. Talk to your health care provider about how often you should have regular mammograms. Talk with your health care provider about your test results, treatment options, and if necessary, the need for more tests. Vaccines  Your health care provider may recommend certain vaccines, such as: Influenza vaccine. This is recommended every year. Tetanus, diphtheria, and acellular pertussis (  Tdap, Td) vaccine. You may need a Td booster every 10 years. Zoster vaccine. You  may need this after age 18. Pneumococcal 13-valent conjugate (PCV13) vaccine. One dose is recommended after age 3. Pneumococcal polysaccharide (PPSV23) vaccine. One dose is recommended after age 64. Talk to your health care provider about which screenings and vaccines you need and how often you need them. This information is not intended to replace advice given to you by your health care provider. Make sure you discuss any questions you have with your health care provider. Document Released: 08/28/2015 Document Revised: 04/20/2016 Document Reviewed: 06/02/2015 Elsevier Interactive Patient Education  2017 [employer] Prevention in the Home Falls can cause injuries. They can happen to people of all ages. There are many things you can do to make your home safe and to help prevent falls. What can I do on the outside of my home? Regularly fix the edges of walkways and driveways and fix any cracks. Remove anything that might make you trip as you walk through a door, such as a raised step or threshold. Trim any bushes or trees on the path to your home. Use bright outdoor lighting. Clear any walking paths of anything that might make someone trip, such as rocks or tools. Regularly check to see if handrails are loose or broken. Make sure that both sides of any steps have handrails. Any raised decks and porches should have guardrails on the edges. Have any leaves, snow, or ice cleared regularly. Use sand or salt on walking paths during winter. Clean up any spills in your garage right away. This includes oil or grease spills. What can I do in the bathroom? Use night lights. Install grab bars by the toilet and in the tub and shower. Do not use towel bars as grab bars. Use non-skid mats or decals in the tub or shower. If you need to sit down in the shower, use a plastic, non-slip stool. Keep the floor dry. Clean up any water that spills on the floor as soon as it happens. Remove soap buildup  in the tub or shower regularly. Attach bath mats securely with double-sided non-slip rug tape. Do not have throw rugs and other things on the floor that can make you trip. What can I do in the bedroom? Use night lights. Make sure that you have a light by your bed that is easy to reach. Do not use any sheets or blankets that are too big for your bed. They should not hang down onto the floor. Have a firm chair that has side arms. You can use this for support while you get dressed. Do not have throw rugs and other things on the floor that can make you trip. What can I do in the kitchen? Clean up any spills right away. Avoid walking on wet floors. Keep items that you use a lot in easy-to-reach places. If you need to reach something above you, use a strong step stool that has a grab bar. Keep electrical cords out of the way. Do not use floor polish or wax that makes floors slippery. If you must use wax, use non-skid floor wax. Do not have throw rugs and other things on the floor that can make you trip. What can I do with my stairs? Do not leave any items on the stairs. Make sure that there are handrails on both sides of the stairs and use them. Fix handrails that are broken or loose. Make sure that handrails are  as long as the stairways. Check any carpeting to make sure that it is firmly attached to the stairs. Fix any carpet that is loose or worn. Avoid having throw rugs at the top or bottom of the stairs. If you do have throw rugs, attach them to the floor with carpet tape. Make sure that you have a light switch at the top of the stairs and the bottom of the stairs. If you do not have them, ask someone to add them for you. What else can I do to help prevent falls? Wear shoes that: Do not have high heels. Have rubber bottoms. Are comfortable and fit you well. Are closed at the toe. Do not wear sandals. If you use a stepladder: Make sure that it is fully opened. Do not climb a closed  stepladder. Make sure that both sides of the stepladder are locked into place. Ask someone to hold it for you, if possible. Clearly mark and make sure that you can see: Any grab bars or handrails. First and last steps. Where the edge of each step is. Use tools that help you move around (mobility aids) if they are needed. These include: Canes. Walkers. Scooters. Crutches. Turn on the lights when you go into a dark area. Replace any light bulbs as soon as they burn out. Set up your furniture so you have a clear path. Avoid moving your furniture around. If any of your floors are uneven, fix them. If there are any pets around you, be aware of where they are. Review your medicines with your doctor. Some medicines can make you feel dizzy. This can increase your chance of falling. Ask your doctor what other things that you can do to help prevent falls. This information is not intended to replace advice given to you by your health care provider. Make sure you discuss any questions you have with your health care provider. Document Released: 05/28/2009 Document Revised: 01/07/2016 Document Reviewed: 09/05/2014 Elsevier Interactive Patient Education  2017 [employer].

## 2021-10-18 NOTE — Progress Notes (Signed)
No critical labs need to be addressed urgently. We will discuss labs in detail at upcoming office visit.   

## 2021-10-22 ENCOUNTER — Other Ambulatory Visit: Payer: Self-pay

## 2021-10-22 ENCOUNTER — Encounter: Payer: Self-pay | Admitting: Family Medicine

## 2021-10-22 ENCOUNTER — Ambulatory Visit (INDEPENDENT_AMBULATORY_CARE_PROVIDER_SITE_OTHER): Payer: PPO | Admitting: Family Medicine

## 2021-10-22 VITALS — BP 124/72 | HR 52 | Ht 67.0 in | Wt 185.0 lb

## 2021-10-22 DIAGNOSIS — E1169 Type 2 diabetes mellitus with other specified complication: Secondary | ICD-10-CM | POA: Diagnosis not present

## 2021-10-22 DIAGNOSIS — E1159 Type 2 diabetes mellitus with other circulatory complications: Secondary | ICD-10-CM

## 2021-10-22 DIAGNOSIS — E039 Hypothyroidism, unspecified: Secondary | ICD-10-CM

## 2021-10-22 DIAGNOSIS — I152 Hypertension secondary to endocrine disorders: Secondary | ICD-10-CM | POA: Diagnosis not present

## 2021-10-22 DIAGNOSIS — E785 Hyperlipidemia, unspecified: Secondary | ICD-10-CM | POA: Diagnosis not present

## 2021-10-22 DIAGNOSIS — E119 Type 2 diabetes mellitus without complications: Secondary | ICD-10-CM

## 2021-10-22 DIAGNOSIS — Z0000 Encounter for general adult medical examination without abnormal findings: Secondary | ICD-10-CM | POA: Diagnosis not present

## 2021-10-22 NOTE — Patient Instructions (Signed)
Get back on track with healthy eating ( low car, low fat less sausage) and regular exercise. ?

## 2021-10-22 NOTE — Progress Notes (Incomplete)
Patient ID: Andrea Browning, female    DOB: September 10, 1950, 71 y.o.   MRN: [id number]  This visit was conducted in person.  BP 124/72    Pulse (!) 52    Ht (!) 5.7" (0.145 m)    Wt 185 lb (83.9 kg)    SpO2 99%    BMI 4003.36 kg/m    CC:  Chief Complaint  Patient presents with   Annual Exam    Physical , pt has no concerns      Subjective:   HPI: Andrea Browning is a 71 y.o. female presenting on 10/22/2021 for Annual Exam (Physical , pt has no concerns )  The patient presents for  complete physical and review of chronic health problems. He/She also has the following acute concerns today: none  Diabetes:   At goal on metformin XR 500 mg daily Lab Results  Component Value Date   HGBA1C 7.0 (H) 10/15/2021  Using medications without difficulties: Hypoglycemic episodes: none Hyperglycemic episodes: none Feet problems: no ulcer Blood Sugars averaging:  FBS 114 eye exam within last year:  yes  Hypertension:    Well controlled on lisinopril 10 mg daily BP Readings from Last 3 Encounters:  10/22/21 124/72  04/22/21 108/64  10/20/20 123/61  Using medication without problems or lightheadedness:  one Chest pain with exertion:none Edema: none Short of breath: none Average home BPs: Other issues:  Elevated Cholesterol:  LDL at goal < 100 on crestor 20 mg daily Lab Results  Component Value Date   CHOL 156 10/15/2021   HDL 44.40 10/15/2021   LDLCALC 95 10/15/2021   TRIG 84.0 10/15/2021   CHOLHDL 4 10/15/2021  Using medications without problems: none Muscle aches:  none Diet compliance: has been eating daily sausage. Exercise: none.. plans to restart Other complaints:   Hypothyroid  Stable control on 88 mcg daily Lab Results  Component Value Date   TSH 0.40 10/15/2021       Relevant past medical, surgical, family and social history reviewed and updated as indicated. Interim medical history since our last visit reviewed. Allergies and medications reviewed and  updated. Outpatient Medications Prior to Visit  Medication Sig Dispense Refill   betamethasone dipropionate 0.05 % cream Apply topically 2 (two) times daily. 30 g 0   Cholecalciferol (VITAMIN D3) 125 MCG (5000 UT) CAPS Take 1 capsule by mouth daily.     FLUZONE HIGH-DOSE QUADRIVALENT 0.7 ML SUSY      levothyroxine (SYNTHROID) 88 MCG tablet TAKE 1 TAB BY MOUTH 1XDAY ON AN EMPTY STOMACH W/GLASS OF WATER AT LEAST 30-60 MINS BEFORE BREAKFAST 90 tablet 3   lisinopril (ZESTRIL) 10 MG tablet TAKE 1 TABLET BY MOUTH EVERY DAY 90 tablet 0   metFORMIN (GLUCOPHAGE-XR) 500 MG 24 hr tablet TAKE 1 TABLET BY MOUTH EVERY DAY WITH DINNER 90 tablet 1   rosuvastatin (CRESTOR) 20 MG tablet TAKE 1 TABLET (20 MG TOTAL) BY MOUTH DAILY. STOP LOVASTATIN. 90 tablet 3   No facility-administered medications prior to visit.     Per HPI unless specifically indicated in ROS section below Review of Systems  Constitutional:  Negative for fatigue and fever.  HENT:  Negative for congestion.   Eyes:  Negative for pain.  Respiratory:  Negative for cough and shortness of breath.   Cardiovascular:  Negative for chest pain, palpitations and leg swelling.  Gastrointestinal:  Negative for abdominal pain.  Genitourinary:  Negative for dysuria and vaginal bleeding.  Musculoskeletal:  Negative for back pain.  Neurological:  Negative for syncope, light-headedness and headaches.  Psychiatric/Behavioral:  Negative for dysphoric mood.   Objective:  BP 124/72    Pulse (!) 52    Ht (!) 5.7" (0.145 m)    Wt 185 lb (83.9 kg)    SpO2 99%    BMI 4003.36 kg/m   Wt Readings from Last 3 Encounters:  10/22/21 185 lb (83.9 kg)  10/18/21 180 lb (81.6 kg)  04/22/21 180 lb 8 oz (81.9 kg)      Physical Exam  Diabetic foot exam: Normal inspection No skin breakdown No calluses  Normal DP pulses Normal sensation to light touch and monofilament Nails normal    Results for orders placed or performed in visit on 10/15/21  TSH   Result Value Ref Range   TSH 0.40 0.35 - 5.50 uIU/mL  T4, free  Result Value Ref Range   Free T4 0.92 0.60 - 1.60 ng/dL  T3, free  Result Value Ref Range   T3, Free 3.0 2.3 - 4.2 pg/mL  Comprehensive metabolic panel  Result Value Ref Range   Sodium 142 135 - 145 mEq/L   Potassium 4.1 3.5 - 5.1 mEq/L   Chloride 105 96 - 112 mEq/L   CO2 30 19 - 32 mEq/L   Glucose, Bld 120 (H) 70 - 99 mg/dL   BUN 23 6 - 23 mg/dL   Creatinine, Ser 0.82 0.40 - 1.20 mg/dL   Total Bilirubin 0.7 0.2 - 1.2 mg/dL   Alkaline Phosphatase 53 39 - 117 U/L   AST 14 0 - 37 U/L   ALT 13 0 - 35 U/L   Total Protein 6.4 6.0 - 8.3 g/dL   Albumin 4.2 3.5 - 5.2 g/dL   GFR 72.44 >60.00 mL/min   Calcium 9.3 8.4 - 10.5 mg/dL  Lipid panel  Result Value Ref Range   Cholesterol 156 0 - 200 mg/dL   Triglycerides 84.0 0.0 - 149.0 mg/dL   HDL 44.40 >39.00 mg/dL   VLDL 16.8 0.0 - 40.0 mg/dL   LDL Cholesterol 95 0 - 99 mg/dL   Total CHOL/HDL Ratio 4    NonHDL 111.57   Hemoglobin A1c  Result Value Ref Range   Hgb A1c MFr Bld 7.0 (H) 4.6 - 6.5 %    This visit occurred during the SARS-CoV-2 public health emergency.  Safety protocols were in place, including screening questions prior to the visit, additional usage of staff PPE, and extensive cleaning of exam room while observing appropriate contact time as indicated for disinfecting solutions.   COVID 19 screen:  No recent travel or known exposure to COVID19 The patient denies respiratory symptoms of COVID 19 at this time. The importance of social distancing was discussed today.   Assessment and Plan The patient's preventative maintenance and recommended screening tests for an annual wellness exam were reviewed in full today. Brought up to date unless services declined.  Counselled on the importance of diet, exercise, and its role in overall health and mortality. The patient's FH and SH was reviewed, including their home life, tobacco status, and drug and alcohol  status.   Vaccines: COVID, Td, PNA, flu shingles uptodate Pap/DVE:  Not indicated Mammo: 05/2021 Bone Density: Normal 05/2020, repeat in 5 years Colon: Jefm Bryant, Dr. Vira Agar, 05/30/2018,  Mother with colon cancer, q 5 years  Smoking Status: none ETOH/ drug TDV:VOHY/WVPX  Hep C:   done    Eliezer Lofts, MD

## 2021-11-02 ENCOUNTER — Telehealth: Payer: Self-pay

## 2021-11-02 NOTE — Chronic Care Management (AMB) (Signed)
Transition CCM to Self Care ? ?Patient contacted to inform they have achieved their CCM goals and no longer need to be contacted as frequently. Patient advised services will still be available to them if they would like to reach out or have any new health concerns. Verified patient had contact information to pharmacist and health concierge on hand. Patient made aware CCM services would be continued if desired. Patient consented to cancel future CCM appointments.  ? ?Charlene Brooke, CPP notified ? ?Andrea Browning, CCMA ?Health concierge  ?[phone]  ?

## 2021-11-12 NOTE — Assessment & Plan Note (Addendum)
Chronic, well controlled ?She is up-to-date with yearly eye exam. ?Foot exam performed in office today.  . ? ?Continue metformin XR 500 mg p.o. daily ? ?

## 2021-11-12 NOTE — Assessment & Plan Note (Signed)
Chronic, well controlled ? ?Continue levothyroxine 88 mcg daily ?

## 2021-11-12 NOTE — Assessment & Plan Note (Signed)
Chronic, well controlled ? ?Continue lisinopril 10 mg daily ?

## 2021-11-12 NOTE — Assessment & Plan Note (Signed)
Chronic, well controlled ? ?LDL at goal less than 100. ?Continue Crestor 20 mg daily. ?

## 2021-11-22 ENCOUNTER — Telehealth: Payer: PPO

## 2021-12-10 ENCOUNTER — Other Ambulatory Visit: Payer: Self-pay | Admitting: Family Medicine

## 2021-12-25 ENCOUNTER — Other Ambulatory Visit: Payer: Self-pay | Admitting: Family Medicine

## 2021-12-25 DIAGNOSIS — E119 Type 2 diabetes mellitus without complications: Secondary | ICD-10-CM

## 2022-04-09 ENCOUNTER — Telehealth: Payer: Self-pay | Admitting: Family Medicine

## 2022-04-09 DIAGNOSIS — E039 Hypothyroidism, unspecified: Secondary | ICD-10-CM

## 2022-04-09 DIAGNOSIS — E119 Type 2 diabetes mellitus without complications: Secondary | ICD-10-CM

## 2022-04-09 NOTE — Telephone Encounter (Signed)
-----   Message from Ellamae Sia sent at 04/05/2022  4:27 PM EDT ----- Regarding: Lab orders for Thursday, 9.7.23 Lab orders for f/u dm appt

## 2022-04-21 ENCOUNTER — Other Ambulatory Visit: Payer: PPO

## 2022-04-25 ENCOUNTER — Other Ambulatory Visit (INDEPENDENT_AMBULATORY_CARE_PROVIDER_SITE_OTHER): Payer: PPO

## 2022-04-25 DIAGNOSIS — E119 Type 2 diabetes mellitus without complications: Secondary | ICD-10-CM | POA: Diagnosis not present

## 2022-04-25 LAB — HEMOGLOBIN A1C: Hgb A1c MFr Bld: 6.7 % — ABNORMAL HIGH (ref 4.6–6.5)

## 2022-04-26 NOTE — Progress Notes (Signed)
No critical labs need to be addressed urgently. We will discuss labs in detail at upcoming office visit.   

## 2022-04-28 ENCOUNTER — Encounter: Payer: Self-pay | Admitting: Family Medicine

## 2022-04-28 ENCOUNTER — Ambulatory Visit (INDEPENDENT_AMBULATORY_CARE_PROVIDER_SITE_OTHER): Payer: PPO | Admitting: Family Medicine

## 2022-04-28 VITALS — BP 110/66 | HR 50 | Temp 98.8°F | Ht 67.0 in | Wt 178.0 lb

## 2022-04-28 DIAGNOSIS — I152 Hypertension secondary to endocrine disorders: Secondary | ICD-10-CM

## 2022-04-28 DIAGNOSIS — E119 Type 2 diabetes mellitus without complications: Secondary | ICD-10-CM

## 2022-04-28 DIAGNOSIS — E1159 Type 2 diabetes mellitus with other circulatory complications: Secondary | ICD-10-CM

## 2022-04-28 DIAGNOSIS — Z23 Encounter for immunization: Secondary | ICD-10-CM | POA: Diagnosis not present

## 2022-04-28 NOTE — Progress Notes (Signed)
Patient ID: Andrea Browning, female    DOB: 04/14/1951, 71 y.o.   MRN: [id number]  This visit was conducted in person.  BP 110/66   Pulse (!) 50   Temp 98.8 F (37.1 C) (Oral)   Ht '5\' 7"'$  (1.702 m)   Wt 178 lb (80.7 kg)   SpO2 97%   BMI 27.88 kg/m    CC:  Chief Complaint  Patient presents with   Diabetes    Subjective:   HPI: Andrea Browning is a 71 y.o. female presenting on 04/28/2022 for Diabetes 32-monthfollow-up  Diabetes: Improving control with continued use of metformin XR 500 mg p.o. daily.  She has been working on diet changes... more veggie plates. Lab Results  Component Value Date   HGBA1C 6.7 (H) 04/25/2022  Using medications without difficulties: none Hypoglycemic episodes:none Hyperglycemic episodes: none Feet problems: no ulcer Blood Sugars averaging: FBS 107 eye exam within last year:  done 08/2021  Wt Readings from Last 3 Encounters:  04/28/22 178 lb (80.7 kg)  10/22/21 185 lb (83.9 kg)  10/18/21 180 lb (81.6 kg)     Hypertension:  Well-controlled on lisinopril 10 mg p.o. daily BP Readings from Last 3 Encounters:  04/28/22 110/66  10/22/21 124/72  04/22/21 108/64  Using medication without problems or lightheadedness:  none Chest pain with exertion: none Edema:none Short of breath: none Average home BPs: Other issues:       Relevant past medical, surgical, family and social history reviewed and updated as indicated. Interim medical history since our last visit reviewed. Allergies and medications reviewed and updated. Outpatient Medications Prior to Visit  Medication Sig Dispense Refill   betamethasone dipropionate 0.05 % cream Apply topically 2 (two) times daily. 30 g 0   Cholecalciferol (VITAMIN D3) 125 MCG (5000 UT) CAPS Take 1 capsule by mouth daily.     levothyroxine (SYNTHROID) 88 MCG tablet TAKE 1 TAB BY MOUTH 1XDAY ON AN EMPTY STOMACH W/GLASS OF WATER AT LEAST 30-60 MINS BEFORE BREAKFAST 90 tablet 3   lisinopril (ZESTRIL)  10 MG tablet TAKE 1 TABLET BY MOUTH EVERY DAY 90 tablet 1   metFORMIN (GLUCOPHAGE-XR) 500 MG 24 hr tablet TAKE 1 TABLET BY MOUTH EVERY DAY WITH DINNER 90 tablet 1   rosuvastatin (CRESTOR) 20 MG tablet TAKE 1 TABLET (20 MG TOTAL) BY MOUTH DAILY. STOP LOVASTATIN. 90 tablet 3   FLUZONE HIGH-DOSE QUADRIVALENT 0.7 ML SUSY      No facility-administered medications prior to visit.     Per HPI unless specifically indicated in ROS section below Review of Systems  Constitutional:  Negative for fatigue and fever.  HENT:  Negative for congestion.   Eyes:  Negative for pain.  Respiratory:  Negative for cough and shortness of breath.   Cardiovascular:  Negative for chest pain, palpitations and leg swelling.  Gastrointestinal:  Negative for abdominal pain.  Genitourinary:  Negative for dysuria and vaginal bleeding.  Musculoskeletal:  Negative for back pain.  Neurological:  Negative for syncope, light-headedness and headaches.  Psychiatric/Behavioral:  Negative for dysphoric mood.    Objective:  BP 110/66   Pulse (!) 50   Temp 98.8 F (37.1 C) (Oral)   Ht '5\' 7"'$  (1.702 m)   Wt 178 lb (80.7 kg)   SpO2 97%   BMI 27.88 kg/m   Wt Readings from Last 3 Encounters:  04/28/22 178 lb (80.7 kg)  10/22/21 185 lb (83.9 kg)  10/18/21 180 lb (81.6 kg)  Physical Exam Constitutional:      General: She is not in acute distress.    Appearance: Normal appearance. She is well-developed. She is not ill-appearing or toxic-appearing.  HENT:     Head: Normocephalic.     Right Ear: Hearing, tympanic membrane, ear canal and external ear normal. Tympanic membrane is not erythematous, retracted or bulging.     Left Ear: Hearing, tympanic membrane, ear canal and external ear normal. Tympanic membrane is not erythematous, retracted or bulging.     Nose: No mucosal edema or rhinorrhea.     Right Sinus: No maxillary sinus tenderness or frontal sinus tenderness.     Left Sinus: No maxillary sinus tenderness or  frontal sinus tenderness.     Mouth/Throat:     Pharynx: Uvula midline.  Eyes:     General: Lids are normal. Lids are everted, no foreign bodies appreciated.     Conjunctiva/sclera: Conjunctivae normal.     Pupils: Pupils are equal, round, and reactive to light.  Neck:     Thyroid: No thyroid mass or thyromegaly.     Vascular: No carotid bruit.     Trachea: Trachea normal.  Cardiovascular:     Rate and Rhythm: Normal rate and regular rhythm.     Pulses: Normal pulses.     Heart sounds: Normal heart sounds, S1 normal and S2 normal. No murmur heard.    No friction rub. No gallop.  Pulmonary:     Effort: Pulmonary effort is normal. No tachypnea or respiratory distress.     Breath sounds: Normal breath sounds. No decreased breath sounds, wheezing, rhonchi or rales.  Abdominal:     General: Bowel sounds are normal.     Palpations: Abdomen is soft.     Tenderness: There is no abdominal tenderness.  Musculoskeletal:     Cervical back: Normal range of motion and neck supple.  Skin:    General: Skin is warm and dry.     Findings: No rash.  Neurological:     Mental Status: She is alert.  Psychiatric:        Mood and Affect: Mood is not anxious or depressed.        Speech: Speech normal.        Behavior: Behavior normal. Behavior is cooperative.        Thought Content: Thought content normal.        Judgment: Judgment normal.       Results for orders placed or performed in visit on 04/25/22  Hemoglobin A1c  Result Value Ref Range   Hgb A1c MFr Bld 6.7 (H) 4.6 - 6.5 %     COVID 19 screen:  No recent travel or known exposure to COVID19 The patient denies respiratory symptoms of COVID 19 at this time. The importance of social distancing was discussed today.   Assessment and Plan    Problem List Items Addressed This Visit     Controlled type 2 diabetes mellitus without complication, without long-term current use of insulin ([place]) - Primary (Chronic)    Chronic, improved  control with lifestyle change.  She has lost approximately 10 pounds in the last 6 months with diet changes.  She will continue metformin XR 500 mg p.o. daily but will keep an eye out for hypoglycemia.  We discussed how to treat hypoglycemia in detail today.  She will call if her blood sugars are dropping low for possible adjustment of her medication.      Hypertension associated with diabetes ([place]) (  Chronic)    Chronic well-controlled Continue lisinopril 10 mg p.o. daily. Follow blood pressure at home, if decreasing with continued weight loss she will call for medication adjustment.        Eliezer Lofts, MD

## 2022-04-28 NOTE — Assessment & Plan Note (Signed)
Chronic, improved control with lifestyle change.  She has lost approximately 10 pounds in the last 6 months with diet changes.  She will continue metformin XR 500 mg p.o. daily but will keep an eye out for hypoglycemia.  We discussed how to treat hypoglycemia in detail today.  She will call if her blood sugars are dropping low for possible adjustment of her medication.

## 2022-04-28 NOTE — Assessment & Plan Note (Signed)
Chronic well-controlled Continue lisinopril 10 mg p.o. daily. Follow blood pressure at home, if decreasing with continued weight loss she will call for medication adjustment.

## 2022-04-28 NOTE — Addendum Note (Signed)
Addended by: Carter Kitten on: 04/28/2022 09:58 AM   Modules accepted: Orders

## 2022-04-28 NOTE — Patient Instructions (Signed)
Keep up the great work!

## 2022-05-16 ENCOUNTER — Other Ambulatory Visit: Payer: Self-pay | Admitting: Family Medicine

## 2022-05-16 DIAGNOSIS — E119 Type 2 diabetes mellitus without complications: Secondary | ICD-10-CM

## 2022-05-16 DIAGNOSIS — E782 Mixed hyperlipidemia: Secondary | ICD-10-CM

## 2022-05-25 DIAGNOSIS — J029 Acute pharyngitis, unspecified: Secondary | ICD-10-CM | POA: Diagnosis not present

## 2022-05-25 DIAGNOSIS — J019 Acute sinusitis, unspecified: Secondary | ICD-10-CM | POA: Diagnosis not present

## 2022-05-25 DIAGNOSIS — Z03818 Encounter for observation for suspected exposure to other biological agents ruled out: Secondary | ICD-10-CM | POA: Diagnosis not present

## 2022-05-25 DIAGNOSIS — B9689 Other specified bacterial agents as the cause of diseases classified elsewhere: Secondary | ICD-10-CM | POA: Diagnosis not present

## 2022-06-09 ENCOUNTER — Other Ambulatory Visit: Payer: Self-pay | Admitting: Family Medicine

## 2022-06-24 ENCOUNTER — Other Ambulatory Visit: Payer: Self-pay | Admitting: Family Medicine

## 2022-06-24 DIAGNOSIS — E119 Type 2 diabetes mellitus without complications: Secondary | ICD-10-CM

## 2022-07-01 ENCOUNTER — Other Ambulatory Visit: Payer: Self-pay | Admitting: Family Medicine

## 2022-07-01 DIAGNOSIS — Z1231 Encounter for screening mammogram for malignant neoplasm of breast: Secondary | ICD-10-CM

## 2022-07-27 ENCOUNTER — Ambulatory Visit
Admission: RE | Admit: 2022-07-27 | Discharge: 2022-07-27 | Disposition: A | Discharge status: Home / Self Care | Payer: PPO | Source: Ambulatory Visit | Attending: Family Medicine | Admitting: Family Medicine

## 2022-07-27 DIAGNOSIS — Z1231 Encounter for screening mammogram for malignant neoplasm of breast: Secondary | ICD-10-CM | POA: Insufficient documentation

## 2022-09-16 DIAGNOSIS — U071 COVID-19: Secondary | ICD-10-CM | POA: Diagnosis not present

## 2022-09-16 DIAGNOSIS — Z03818 Encounter for observation for suspected exposure to other biological agents ruled out: Secondary | ICD-10-CM | POA: Diagnosis not present

## 2022-09-16 DIAGNOSIS — B9689 Other specified bacterial agents as the cause of diseases classified elsewhere: Secondary | ICD-10-CM | POA: Diagnosis not present

## 2022-09-16 DIAGNOSIS — J019 Acute sinusitis, unspecified: Secondary | ICD-10-CM | POA: Diagnosis not present

## 2022-09-16 DIAGNOSIS — J209 Acute bronchitis, unspecified: Secondary | ICD-10-CM | POA: Diagnosis not present

## 2022-10-03 DIAGNOSIS — H2513 Age-related nuclear cataract, bilateral: Secondary | ICD-10-CM | POA: Diagnosis not present

## 2022-10-03 LAB — HM DIABETES EYE EXAM

## 2022-10-14 ENCOUNTER — Telehealth: Payer: Self-pay | Admitting: *Deleted

## 2022-10-14 DIAGNOSIS — E119 Type 2 diabetes mellitus without complications: Secondary | ICD-10-CM

## 2022-10-14 DIAGNOSIS — E1169 Type 2 diabetes mellitus with other specified complication: Secondary | ICD-10-CM

## 2022-10-14 DIAGNOSIS — E039 Hypothyroidism, unspecified: Secondary | ICD-10-CM

## 2022-10-14 NOTE — Telephone Encounter (Signed)
-----   Message from Velna Hatchet, RT sent at 10/10/2022 10:00 AM EST ----- Regarding: Mon 3/11 lab Patient is scheduled for cpx, please order future labs.  Thanks, Anda Kraft

## 2022-10-20 ENCOUNTER — Ambulatory Visit (INDEPENDENT_AMBULATORY_CARE_PROVIDER_SITE_OTHER): Payer: PPO

## 2022-10-20 VITALS — Ht 67.0 in | Wt 180.0 lb

## 2022-10-20 DIAGNOSIS — Z0000 Encounter for general adult medical examination without abnormal findings: Secondary | ICD-10-CM

## 2022-10-20 NOTE — Patient Instructions (Signed)
Andrea Browning , Thank you for taking time to come for your Medicare Wellness Visit. I appreciate your ongoing commitment to your health goals. Please review the following plan we discussed and let me know if I can assist you in the future.   These are the goals we discussed:  Goals      Patient Stated     09/06/2019, I will maintain and eat a healthier diet.     Patient Stated     10/16/2020, I will maintain and continue medications as prescribed.      Patient Stated     Would like to start walking again      Patient Stated     Continue to be active and watch what I eat.        This is a list of the screening recommended for you and due dates:  Health Maintenance  Topic Date Due   Yearly kidney health urinalysis for diabetes  09/05/2020   Pneumonia Vaccine (3 of 3 - PPSV23 or PCV20) 03/09/2022   COVID-19 Vaccine (4 - 2023-24 season) 04/15/2022   Yearly kidney function blood test for diabetes  10/16/2022   Complete foot exam   10/19/2022   Hemoglobin A1C  10/24/2022   Colon Cancer Screening  05/31/2023   Mammogram  07/28/2023   Eye exam for diabetics  10/04/2023   Medicare Annual Wellness Visit  10/20/2023   DEXA scan (bone density measurement)  05/28/2025   DTaP/Tdap/Td vaccine (2 - Td or Tdap) 03/02/2030   Flu Shot  Completed   Hepatitis C Screening: USPSTF Recommendation to screen - Ages 18-79 yo.  Completed   Zoster (Shingles) Vaccine  Completed   HPV Vaccine  Aged Out    Advanced directives: Advance directive discussed with you today. Even though you declined this today, please call our office should you change your mind, and we can give you the proper paperwork for you to fill out.   Conditions/risks identified: Aim for 30 minutes of exercise or brisk walking, 6-8 glasses of water, and 5 servings of fruits and vegetables each day.   Next appointment: Follow up in one year for your annual wellness visit 10/25/2023 @ 8:15 via telephone.   Preventive Care 38 Years and  Older, Female Preventive care refers to lifestyle choices and visits with your health care provider that can promote health and wellness. What does preventive care include? A yearly physical exam. This is also called an annual well check. Dental exams once or twice a year. Routine eye exams. Ask your health care provider how often you should have your eyes checked. Personal lifestyle choices, including: Daily care of your teeth and gums. Regular physical activity. Eating a healthy diet. Avoiding tobacco and drug use. Limiting alcohol use. Practicing safe sex. Taking low-dose aspirin every day. Taking vitamin and mineral supplements as recommended by your health care provider. What happens during an annual well check? The services and screenings done by your health care provider during your annual well check will depend on your age, overall health, lifestyle risk factors, and family history of disease. Counseling  Your health care provider may ask you questions about your: Alcohol use. Tobacco use. Drug use. Emotional well-being. Home and relationship well-being. Sexual activity. Eating habits. History of falls. Memory and ability to understand (cognition). Work and work Statistician. Reproductive health. Screening  You may have the following tests or measurements: Height, weight, and BMI. Blood pressure. Lipid and cholesterol levels. These may be checked every 5 years,  or more frequently if you are over 19 years old. Skin check. Lung cancer screening. You may have this screening every year starting at age 54 if you have a 30-pack-year history of smoking and currently smoke or have quit within the past 15 years. Fecal occult blood test (FOBT) of the stool. You may have this test every year starting at age 29. Flexible sigmoidoscopy or colonoscopy. You may have a sigmoidoscopy every 5 years or a colonoscopy every 10 years starting at age 65. Hepatitis C blood test. Hepatitis B  blood test. Sexually transmitted disease (STD) testing. Diabetes screening. This is done by checking your blood sugar (glucose) after you have not eaten for a while (fasting). You may have this done every 1-3 years. Bone density scan. This is done to screen for osteoporosis. You may have this done starting at age 62. Mammogram. This may be done every 1-2 years. Talk to your health care provider about how often you should have regular mammograms. Talk with your health care provider about your test results, treatment options, and if necessary, the need for more tests. Vaccines  Your health care provider may recommend certain vaccines, such as: Influenza vaccine. This is recommended every year. Tetanus, diphtheria, and acellular pertussis (Tdap, Td) vaccine. You may need a Td booster every 10 years. Zoster vaccine. You may need this after age 65. Pneumococcal 13-valent conjugate (PCV13) vaccine. One dose is recommended after age 68. Pneumococcal polysaccharide (PPSV23) vaccine. One dose is recommended after age 33. Talk to your health care provider about which screenings and vaccines you need and how often you need them. This information is not intended to replace advice given to you by your health care provider. Make sure you discuss any questions you have with your health care provider. Document Released: 08/28/2015 Document Revised: 04/20/2016 Document Reviewed: 06/02/2015 Elsevier Interactive Patient Education  2017 [place] Prevention in the Home Falls can cause injuries. They can happen to people of all ages. There are many things you can do to make your home safe and to help prevent falls. What can I do on the outside of my home? Regularly fix the edges of walkways and driveways and fix any cracks. Remove anything that might make you trip as you walk through a door, such as a raised step or threshold. Trim any bushes or trees on the path to your home. Use bright outdoor  lighting. Clear any walking paths of anything that might make someone trip, such as rocks or tools. Regularly check to see if handrails are loose or broken. Make sure that both sides of any steps have handrails. Any raised decks and porches should have guardrails on the edges. Have any leaves, snow, or ice cleared regularly. Use sand or salt on walking paths during winter. Clean up any spills in your garage right away. This includes oil or grease spills. What can I do in the bathroom? Use night lights. Install grab bars by the toilet and in the tub and shower. Do not use towel bars as grab bars. Use non-skid mats or decals in the tub or shower. If you need to sit down in the shower, use a plastic, non-slip stool. Keep the floor dry. Clean up any water that spills on the floor as soon as it happens. Remove soap buildup in the tub or shower regularly. Attach bath mats securely with double-sided non-slip rug tape. Do not have throw rugs and other things on the floor that can make you trip.  What can I do in the bedroom? Use night lights. Make sure that you have a light by your bed that is easy to reach. Do not use any sheets or blankets that are too big for your bed. They should not hang down onto the floor. Have a firm chair that has side arms. You can use this for support while you get dressed. Do not have throw rugs and other things on the floor that can make you trip. What can I do in the kitchen? Clean up any spills right away. Avoid walking on wet floors. Keep items that you use a lot in easy-to-reach places. If you need to reach something above you, use a strong step stool that has a grab bar. Keep electrical cords out of the way. Do not use floor polish or wax that makes floors slippery. If you must use wax, use non-skid floor wax. Do not have throw rugs and other things on the floor that can make you trip. What can I do with my stairs? Do not leave any items on the stairs. Make  sure that there are handrails on both sides of the stairs and use them. Fix handrails that are broken or loose. Make sure that handrails are as long as the stairways. Check any carpeting to make sure that it is firmly attached to the stairs. Fix any carpet that is loose or worn. Avoid having throw rugs at the top or bottom of the stairs. If you do have throw rugs, attach them to the floor with carpet tape. Make sure that you have a light switch at the top of the stairs and the bottom of the stairs. If you do not have them, ask someone to add them for you. What else can I do to help prevent falls? Wear shoes that: Do not have high heels. Have rubber bottoms. Are comfortable and fit you well. Are closed at the toe. Do not wear sandals. If you use a stepladder: Make sure that it is fully opened. Do not climb a closed stepladder. Make sure that both sides of the stepladder are locked into place. Ask someone to hold it for you, if possible. Clearly mark and make sure that you can see: Any grab bars or handrails. First and last steps. Where the edge of each step is. Use tools that help you move around (mobility aids) if they are needed. These include: Canes. Walkers. Scooters. Crutches. Turn on the lights when you go into a dark area. Replace any light bulbs as soon as they burn out. Set up your furniture so you have a clear path. Avoid moving your furniture around. If any of your floors are uneven, fix them. If there are any pets around you, be aware of where they are. Review your medicines with your doctor. Some medicines can make you feel dizzy. This can increase your chance of falling. Ask your doctor what other things that you can do to help prevent falls. This information is not intended to replace advice given to you by your health care provider. Make sure you discuss any questions you have with your health care provider. Document Released: 05/28/2009 Document Revised: 01/07/2016  Document Reviewed: 09/05/2014 Elsevier Interactive Patient Education  2017 [employer].

## 2022-10-20 NOTE — Progress Notes (Signed)
I connected with  [name] on 10/20/22 by a audio enabled telemedicine application and verified that I am speaking with the correct person using two identifiers.  Patient Location: Home  Provider Location: Home Office  I discussed the limitations of evaluation and management by telemedicine. The patient expressed understanding and agreed to proceed.  Subjective:   Andrea Browning is a 72 y.o. female who presents for Medicare Annual (Subsequent) preventive examination.  Review of Systems      Cardiac Risk Factors include: advanced age (>79mn, >>110women);hypertension;diabetes mellitus     Objective:    Today's Vitals   10/20/22 0756  Weight: 180 lb (81.6 kg)  Height: '5\' 7"'$  (1.702 m)   Body mass index is 28.19 kg/m.     10/20/2022    8:10 AM 10/18/2021    8:18 AM 10/16/2020    8:09 AM 09/06/2019    2:03 PM 05/30/2018    7:54 AM  Advanced Directives  Does Patient Have a Medical Advance Directive? No No No No No  Does patient want to make changes to medical advance directive?   No - Patient declined    Would patient like information on creating a medical advance directive? No - Patient declined Yes (MAU/Ambulatory/Procedural Areas - Information given)  No - Patient declined No - Patient declined    Current Medications (verified) Outpatient Encounter Medications as of 10/20/2022  Medication Sig   Cholecalciferol (VITAMIN D3) 125 MCG (5000 UT) CAPS Take 1 capsule by mouth daily.   levothyroxine (SYNTHROID) 88 MCG tablet TAKE 1 TAB BY MOUTH 1XDAY ON AN EMPTY STOMACH W/GLASS OF WATER AT LEAST 30-60 MINS BEFORE BREAKFAST   lisinopril (ZESTRIL) 10 MG tablet TAKE 1 TABLET BY MOUTH EVERY DAY   metFORMIN (GLUCOPHAGE-XR) 500 MG 24 hr tablet TAKE 1 TABLET BY MOUTH EVERY DAY WITH DINNER   rosuvastatin (CRESTOR) 20 MG tablet TAKE 1 TABLET (20 MG TOTAL) BY MOUTH DAILY. STOP LOVASTATIN.   betamethasone dipropionate 0.05 % cream Apply topically 2 (two) times daily. (Patient not  taking: Reported on 10/20/2022)   No facility-administered encounter medications on file as of 10/20/2022.    Allergies (verified) Patient has no known allergies.   History: Past Medical History:  Diagnosis Date   Cancer ([place]    Skin   Diabetes mellitus without complication ([place]    Hyperlipidemia    Hypertension    Thyroid disease    Past Surgical History:  Procedure Laterality Date   COLONOSCOPY WITH PROPOFOL N/A 05/30/2018   Procedure: COLONOSCOPY WITH PROPOFOL;  Surgeon: EManya Silvas MD;  [facility];  Service: Endoscopy;  Laterality: N/A;   DILATION AND CURETTAGE OF UTERUS     WISDOM TOOTH EXTRACTION     Family History  Problem Relation Age of Onset   Breast cancer Maternal Aunt 620  Breast cancer Sister 625  Diabetes Sister    Colon cancer Mother    Arthritis Father    Diabetes Maternal Grandmother    Heart disease Brother    Hyperlipidemia Brother    Heart disease Brother    Hyperlipidemia Brother    Social History   Socioeconomic History   Marital status: Married    Spouse name:  Darryl   Number of children: 2   Years of education: Not on file   Highest education level: Not on file  Occupational History   Not on file  Tobacco Use   Smoking status: Never   Smokeless tobacco: Never  Substance  and Sexual Activity   Alcohol use: Never   Drug use: Never   Sexual activity: Not Currently  Other Topics Concern   Not on file  Social History Narrative   Not on file   Social Determinants of Health   Financial Resource Strain: Low Risk  (10/20/2022)   Overall Financial Resource Strain (CARDIA)    Difficulty of Paying Living Expenses: Not hard at all  Food Insecurity: No Food Insecurity (10/20/2022)   Hunger Vital Sign    Worried About Running Out of Food in the Last Year: Never true    Ran Out of Food in the Last Year: Never true  Transportation Needs: No Transportation Needs (10/20/2022)   PRAPARE - Hydrologist  (Medical): No    Lack of Transportation (Non-Medical): No  Physical Activity: Insufficiently Active (10/20/2022)   Exercise Vital Sign    Days of Exercise per Week: 1 day    Minutes of Exercise per Session: 20 min  Stress: No Stress Concern Present (10/20/2022)   [place]    Feeling of Stress : Not at all  Social Connections: Moderately Integrated (10/20/2022)   Social Connection and Isolation Panel [NHANES]    Frequency of Communication with Friends and Family: More than three times a week    Frequency of Social Gatherings with Friends and Family: More than three times a week    Attends Religious Services: More than 4 times per year    Active Member of [employer] or Organizations: No    Attends Music therapist: Never    Marital Status: Married    Tobacco Counseling Counseling given: Not Answered   Clinical Intake:  Pre-visit preparation completed: Yes  Pain : No/denies pain     Nutritional Risks: None Diabetes: Yes CBG done?: Yes (119) CBG resulted in Enter/ Edit results?: No Did pt. bring in CBG monitor from home?: No  How often do you need to have someone help you when you read instructions, pamphlets, or other written materials from your doctor or pharmacy?: 1 - Never  Diabetic?Nutrition Risk Assessment:  Has the patient had any N/V/D within the last 2 months?  No  Does the patient have any non-healing wounds?  No  Has the patient had any unintentional weight loss or weight gain?  No   Diabetes:  Is the patient diabetic?  Yes  If diabetic, was a CBG obtained today?  Yes , 119 per pt. Did the patient bring in their glucometer from home?  No  How often do you monitor your CBG's? QD.   Financial Strains and Diabetes Management:  Are you having any financial strains with the device, your supplies or your medication? No .  Does the patient want to be seen by Chronic Care Management for  management of their diabetes?  No  Would the patient like to be referred to a Nutritionist or for Diabetic Management?  No   Diabetic Exams:  Diabetic Eye Exam: Completed 10/03/2022  Eye Diabetic Foot Exam: Completed 10/18/2021 PCP    Interpreter Needed?: No  Information entered by :: C.Maxine Huynh LPN   Activities of Daily Living    10/20/2022    8:11 AM  In your present state of health, do you have any difficulty performing the following activities:  Hearing? 0  Vision? 0  Difficulty concentrating or making decisions? 0  Walking or climbing stairs? 0  Dressing or bathing? 0  Doing errands, shopping?  0  Preparing Food and eating ? N  Using the Toilet? N  In the past six months, have you accidently leaked urine? N  Do you have problems with loss of bowel control? N  Managing your Medications? N  Managing your Finances? N  Housekeeping or managing your Housekeeping? N    Patient Care Team: Jinny Sanders, MD as PCP - General (Family Medicine) Debbora Dus, [facility] as Pharmacist (Pharmacist)  Indicate any recent Medical Services you may have received from other than [facility] providers in the past year (date may be approximate).     Assessment:   This is a routine wellness examination for Andrea Browning.  Hearing/Vision screen Hearing Screening - Comments:: No aids Vision Screening - Comments:: Glasses - [facility]  Dietary issues and exercise activities discussed: Current Exercise Habits: Home exercise routine;The patient does not participate in regular exercise at present (pt gardenswhen weather is nice), Time (Minutes): 20, Frequency (Times/Week): 1, Weekly Exercise (Minutes/Week): 20, Intensity: Mild, Exercise limited by: None identified   Goals Addressed             This Visit's Progress    Patient Stated       Continue to be active and watch what I eat.       Depression Screen    10/20/2022    8:08 AM 10/22/2021    9:38 AM 10/18/2021    8:20 AM 10/16/2020    8:11  AM 09/13/2019   10:25 AM 09/06/2019    2:04 PM 02/18/2019    9:47 AM  PHQ 2/9 Scores  PHQ - 2 Score 0 0 0 0 0 0 0  PHQ- 9 Score    0 0 0 0    Fall Risk    10/20/2022    8:11 AM 10/22/2021    9:38 AM 10/18/2021    8:20 AM 10/16/2020    8:10 AM 09/13/2019   10:25 AM  Fall Risk   Falls in the past year? 0 0 0 0 0  Number falls in past yr: 0 0 0 0   Injury with Fall? 0 0 0 0   Risk for fall due to : No Fall Risks  No Fall Risks Medication side effect   Follow up Falls prevention discussed;Falls evaluation completed Falls evaluation completed Falls prevention discussed Falls evaluation completed;Falls prevention discussed     FALL RISK PREVENTION PERTAINING TO THE HOME:  Any stairs in or around the home? Yes  If so, are there any without handrails? No  Home free of loose throw rugs in walkways, pet beds, electrical cords, etc? Yes  Adequate lighting in your home to reduce risk of falls? Yes   ASSISTIVE DEVICES UTILIZED TO PREVENT FALLS:  Life alert? No  Use of a cane, walker or w/c? No  Grab bars in the bathroom? No  Shower chair or bench in shower? Yes  Elevated toilet seat or a handicapped toilet? No    Cognitive Function:    10/16/2020    8:13 AM 09/06/2019    2:06 PM  MMSE - Mini Mental State Exam  Orientation to time 5 5  Orientation to Place 5 5  Registration 3 3  Attention/ Calculation 5 5  Recall 3 3  Language- repeat 1 1        10/20/2022    8:12 AM  6CIT Screen  What Year? 0 points  What month? 0 points  What time? 0 points  Count back from 20 0 points  Months  in reverse 0 points  Repeat phrase 4 points  Total Score 4 points    Immunizations Immunization History  Administered Date(s) Administered   Fluad Quad(high Dose 65+) 05/13/2019, 04/28/2022   Influenza, High Dose Seasonal PF 05/23/2017, 05/11/2018, 05/11/2020   Influenza-Unspecified 05/14/2021   Moderna Sars-Covid-2 Vaccination 12/30/2019, 01/27/2020, 08/11/2020   Pneumococcal Conjugate-13  03/09/2017   Pneumococcal Polysaccharide-23 03/08/2016   Tdap 03/02/2020   Zoster Recombinat (Shingrix) 03/02/2020, 07/14/2020   Zoster, Live 05/31/2013    TDAP status: Up to date  Flu Vaccine status: Up to date  Pneumococcal vaccine status: Due, Education has been provided regarding the importance of this vaccine. Advised may receive this vaccine at local pharmacy or Health Dept. Aware to provide a copy of the vaccination record if obtained from local pharmacy or Health Dept. Verbalized acceptance and understanding.  Covid-19 vaccine status: Declined, Education has been provided regarding the importance of this vaccine but patient still declined. Advised may receive this vaccine at local pharmacy or Health Dept.or vaccine clinic. Aware to provide a copy of the vaccination record if obtained from local pharmacy or Health Dept. Verbalized acceptance and understanding.  Qualifies for Shingles Vaccine? Yes   Zostavax completed  unknown   Shingrix Completed?: Yes  Screening Tests Health Maintenance  Topic Date Due   Diabetic kidney evaluation - Urine ACR  09/05/2020   Pneumonia Vaccine 47+ Years old (14 of 3 - PPSV23 or PCV20) 03/09/2022   COVID-19 Vaccine (4 - 2023-24 season) 04/15/2022   Diabetic kidney evaluation - eGFR measurement  10/16/2022   FOOT EXAM  10/19/2022   HEMOGLOBIN A1C  10/24/2022   COLONOSCOPY (Pts 45-93yr Insurance coverage will need to be confirmed)  05/31/2023   MAMMOGRAM  07/28/2023   OPHTHALMOLOGY EXAM  10/04/2023   Medicare Annual Wellness (AWV)  10/20/2023   DEXA SCAN  05/28/2025   DTaP/Tdap/Td (2 - Td or Tdap) 03/02/2030   INFLUENZA VACCINE  Completed   Hepatitis C Screening  Completed   Zoster Vaccines- Shingrix  Completed   HPV VACCINES  Aged Out    Health Maintenance  Health Maintenance Due  Topic Date Due   Diabetic kidney evaluation - Urine ACR  09/05/2020   Pneumonia Vaccine 72 Years old (329of 3 - PPSV23 or PCV20) 03/09/2022   COVID-19  Vaccine (4 - 2023-24 season) 04/15/2022   Diabetic kidney evaluation - eGFR measurement  10/16/2022   FOOT EXAM  10/19/2022    Colorectal cancer screening: Type of screening: Colonoscopy. Completed 05/30/2018. Repeat every 5 years Decline.  Mammogram status: Completed 07/27/2022. Repeat every year  Bone Density status: Completed 05/28/2020. Results reflect: Bone density results: NORMAL. Repeat every 5 years.  Lung Cancer Screening: (Low Dose CT Chest recommended if Age 671-80years, 30 pack-year currently smoking OR have quit w/in 15years.) does not qualify.   Lung Cancer Screening Referral: no  Additional Screening:  Hepatitis C Screening: does not qualify; Completed 09/06/19  Vision Screening: Recommended annual ophthalmology exams for early detection of glaucoma and other disorders of the eye. Is the patient up to date with their annual eye exam?  Yes  Who is the provider or what is the name of the office in which the patient attends annual eye exams? [place] pt is not established with a provider, would they like to be referred to a provider to establish care? No .   Dental Screening: Recommended annual dental exams for proper oral hygiene  Community Resource Referral / Chronic Care Management: CRR required  this visit?  No   CCM required this visit?  No      Plan:     I have personally reviewed and noted the following in the patient's chart:   Medical and social history Use of alcohol, tobacco or illicit drugs  Current medications and supplements including opioid prescriptions. Patient is not currently taking opioid prescriptions. Functional ability and status Nutritional status Physical activity Advanced directives List of other physicians Hospitalizations, surgeries, and ER visits in previous 12 months Vitals Screenings to include cognitive, depression, and falls Referrals and appointments  In addition, I have reviewed and discussed with patient certain  preventive protocols, quality metrics, and best practice recommendations. A written personalized care plan for preventive services as well as general preventive health recommendations were provided to patient.     Lebron Conners, LPN   [id number]   Nurse Notes: Pt declined Colonoscopy at this time.

## 2022-10-24 ENCOUNTER — Other Ambulatory Visit (INDEPENDENT_AMBULATORY_CARE_PROVIDER_SITE_OTHER): Payer: PPO

## 2022-10-24 DIAGNOSIS — E039 Hypothyroidism, unspecified: Secondary | ICD-10-CM

## 2022-10-24 DIAGNOSIS — E785 Hyperlipidemia, unspecified: Secondary | ICD-10-CM | POA: Diagnosis not present

## 2022-10-24 DIAGNOSIS — E1169 Type 2 diabetes mellitus with other specified complication: Secondary | ICD-10-CM | POA: Diagnosis not present

## 2022-10-24 DIAGNOSIS — E119 Type 2 diabetes mellitus without complications: Secondary | ICD-10-CM | POA: Diagnosis not present

## 2022-10-24 LAB — MICROALBUMIN / CREATININE URINE RATIO
Creatinine,U: 85.7 mg/dL
Microalb Creat Ratio: 2.2 mg/g (ref 0.0–30.0)
Microalb, Ur: 1.8 mg/dL (ref 0.0–1.9)

## 2022-10-24 LAB — COMPREHENSIVE METABOLIC PANEL
ALT: 28 U/L (ref 0–35)
AST: 21 U/L (ref 0–37)
Albumin: 3.9 g/dL (ref 3.5–5.2)
Alkaline Phosphatase: 50 U/L (ref 39–117)
BUN: 22 mg/dL (ref 6–23)
CO2: 29 mEq/L (ref 19–32)
Calcium: 9.6 mg/dL (ref 8.4–10.5)
Chloride: 106 mEq/L (ref 96–112)
Creatinine, Ser: 0.83 mg/dL (ref 0.40–1.20)
GFR: 70.89 mL/min (ref >60.00)
Glucose, Bld: 121 mg/dL — ABNORMAL HIGH (ref 70–99)
Potassium: 4.5 mEq/L (ref 3.5–5.1)
Sodium: 142 mEq/L (ref 135–145)
Total Bilirubin: 0.6 mg/dL (ref 0.2–1.2)
Total Protein: 6.2 g/dL (ref 6.0–8.3)

## 2022-10-24 LAB — LIPID PANEL
Cholesterol: 141 mg/dL (ref 0–200)
HDL: 45.8 mg/dL (ref >39.00)
LDL Cholesterol: 80 mg/dL (ref 0–99)
NonHDL: 94.72
Total CHOL/HDL Ratio: 3
Triglycerides: 76 mg/dL (ref 0.0–149.0)
VLDL: 15.2 mg/dL (ref 0.0–40.0)

## 2022-10-24 LAB — HEMOGLOBIN A1C: Hgb A1c MFr Bld: 7.1 % — ABNORMAL HIGH (ref 4.6–6.5)

## 2022-10-24 LAB — T4, FREE: Free T4: 1.1 ng/dL (ref 0.60–1.60)

## 2022-10-24 LAB — T3, FREE: T3, Free: 3.1 pg/mL (ref 2.3–4.2)

## 2022-10-24 LAB — TSH: TSH: 0.07 u[IU]/mL — ABNORMAL LOW (ref 0.35–5.50)

## 2022-10-25 NOTE — Progress Notes (Signed)
No critical labs need to be addressed urgently. We will discuss labs in detail at upcoming office visit.   

## 2022-10-28 ENCOUNTER — Encounter: Payer: Self-pay | Admitting: Family Medicine

## 2022-10-28 ENCOUNTER — Ambulatory Visit (INDEPENDENT_AMBULATORY_CARE_PROVIDER_SITE_OTHER): Payer: PPO | Admitting: Family Medicine

## 2022-10-28 VITALS — BP 120/70 | HR 59 | Temp 98.2°F | Ht 66.35 in | Wt 179.1 lb

## 2022-10-28 DIAGNOSIS — E785 Hyperlipidemia, unspecified: Secondary | ICD-10-CM

## 2022-10-28 DIAGNOSIS — E1169 Type 2 diabetes mellitus with other specified complication: Secondary | ICD-10-CM | POA: Diagnosis not present

## 2022-10-28 DIAGNOSIS — Z0000 Encounter for general adult medical examination without abnormal findings: Secondary | ICD-10-CM

## 2022-10-28 DIAGNOSIS — E119 Type 2 diabetes mellitus without complications: Secondary | ICD-10-CM

## 2022-10-28 DIAGNOSIS — E039 Hypothyroidism, unspecified: Secondary | ICD-10-CM

## 2022-10-28 DIAGNOSIS — I152 Hypertension secondary to endocrine disorders: Secondary | ICD-10-CM

## 2022-10-28 DIAGNOSIS — E1159 Type 2 diabetes mellitus with other circulatory complications: Secondary | ICD-10-CM

## 2022-10-28 LAB — HM DIABETES FOOT EXAM

## 2022-10-28 NOTE — Assessment & Plan Note (Signed)
Chronic, well controlled ? ?Continue levothyroxine 88 mcg daily ?

## 2022-10-28 NOTE — Patient Instructions (Signed)
Keep up with with the regular exercise. Continue heart healthy diet.

## 2022-10-28 NOTE — Assessment & Plan Note (Signed)
Chronic, well controlled ? ?LDL at goal less than 100. ?Continue Crestor 20 mg daily. ?

## 2022-10-28 NOTE — Assessment & Plan Note (Signed)
Chronic well-controlled Continue lisinopril 10 mg p.o. daily. Follow blood pressure at home, if decreasing with continued weight loss she will call for medication adjustment. 

## 2022-10-28 NOTE — Assessment & Plan Note (Signed)
Slight interval worsening on metformin XR 500 mg daily

## 2022-10-28 NOTE — Progress Notes (Signed)
Patient ID: Andrea Browning, female    DOB: Jul 01, 1951, 72 y.o.   MRN: [id number]  This visit was conducted in person.  BP 120/70   Pulse (!) 59   Temp 98.2 F (36.8 C) (Temporal)   Ht 5' 6.35" (1.685 m)   Wt 179 lb 2 oz (81.3 kg)   SpO2 97%   BMI 28.61 kg/m    CC:  Chief Complaint  Patient presents with   Annual Exam    Part 2     Subjective:   HPI: Andrea Browning is a 72 y.o. female presenting on 10/28/2022 for Annual Exam (Part 2)  The patient presents for  complete physical and review of chronic health problems. He/She also has the following acute concerns today: none  Diabetes:    Slight interval worsening on metformin XR 500 mg daily  Worsened when on prednisone for COVID 4-5 weeks ago. Lab Results  Component Value Date   HGBA1C 7.1 (H) 10/24/2022  Using medications without difficulties: Hypoglycemic episodes: none Hyperglycemic episodes: none Feet problems: no ulcer Blood Sugars averaging:  FBS 195 on COVID, now back 117 today eye exam within last year:  yes  Hypertension:    Well controlled on lisinopril 10 mg daily BP Readings from Last 3 Encounters:  10/28/22 120/70  04/28/22 110/66  10/22/21 124/72  Using medication without problems or lightheadedness:  one Chest pain with exertion:none Edema: none Short of breath: none Average home BPs: Other issues:  Elevated Cholesterol:  LDL at goal < 100 on crestor 20 mg daily Lab Results  Component Value Date   CHOL 141 10/24/2022   HDL 45.80 10/24/2022   LDLCALC 80 10/24/2022   TRIG 76.0 10/24/2022   CHOLHDL 3 10/24/2022  Using medications without problems: none Muscle aches:  none Diet compliance:  heart healthy diet Exercise: none.. plans to restart in spring.  Currently gardening. Other complaints:   Hypothyroid  Stable control on 88 mcg daily Lab Results  Component Value Date   TSH 0.07 (L) 10/24/2022   Wt Readings from Last 3 Encounters:  10/28/22 179 lb 2 oz (81.3 kg)  10/20/22  180 lb (81.6 kg)  04/28/22 178 lb (80.7 kg)        Relevant past medical, surgical, family and social history reviewed and updated as indicated. Interim medical history since our last visit reviewed. Allergies and medications reviewed and updated. Outpatient Medications Prior to Visit  Medication Sig Dispense Refill   betamethasone dipropionate 0.05 % cream Apply 1 application  topically 2 (two) times daily as needed.     Cholecalciferol (VITAMIN D3) 25 MCG (1000 UT) capsule Take 1,000 Units by mouth daily.     levothyroxine (SYNTHROID) 88 MCG tablet TAKE 1 TAB BY MOUTH 1XDAY ON AN EMPTY STOMACH W/GLASS OF WATER AT LEAST 30-60 MINS BEFORE BREAKFAST 90 tablet 1   lisinopril (ZESTRIL) 10 MG tablet TAKE 1 TABLET BY MOUTH EVERY DAY 90 tablet 1   metFORMIN (GLUCOPHAGE-XR) 500 MG 24 hr tablet TAKE 1 TABLET BY MOUTH EVERY DAY WITH DINNER 90 tablet 1   rosuvastatin (CRESTOR) 20 MG tablet TAKE 1 TABLET (20 MG TOTAL) BY MOUTH DAILY. STOP LOVASTATIN. 90 tablet 1   betamethasone dipropionate 0.05 % cream Apply topically 2 (two) times daily. 30 g 0   Cholecalciferol (VITAMIN D3) 125 MCG (5000 UT) CAPS Take 1 capsule by mouth daily.     No facility-administered medications prior to visit.     Per HPI unless specifically indicated  in ROS section below Review of Systems  Constitutional:  Negative for fatigue and fever.  HENT:  Negative for congestion.   Eyes:  Negative for pain.  Respiratory:  Negative for cough and shortness of breath.   Cardiovascular:  Negative for chest pain, palpitations and leg swelling.  Gastrointestinal:  Negative for abdominal pain.  Genitourinary:  Negative for dysuria and vaginal bleeding.  Musculoskeletal:  Negative for back pain.  Neurological:  Negative for syncope, light-headedness and headaches.  Psychiatric/Behavioral:  Negative for dysphoric mood.    Objective:  BP 120/70   Pulse (!) 59   Temp 98.2 F (36.8 C) (Temporal)   Ht 5' 6.35" (1.685 m)   Wt 179 lb  2 oz (81.3 kg)   SpO2 97%   BMI 28.61 kg/m   Wt Readings from Last 3 Encounters:  10/28/22 179 lb 2 oz (81.3 kg)  10/20/22 180 lb (81.6 kg)  04/28/22 178 lb (80.7 kg)      Physical Exam Vitals and nursing note reviewed.  Constitutional:      General: She is not in acute distress.    Appearance: Normal appearance. She is well-developed. She is not ill-appearing or toxic-appearing.  HENT:     Head: Normocephalic.     Right Ear: Hearing, tympanic membrane, ear canal and external ear normal.     Left Ear: Hearing, tympanic membrane, ear canal and external ear normal.     Nose: Nose normal.  Eyes:     General: Lids are normal. Lids are everted, no foreign bodies appreciated.     Conjunctiva/sclera: Conjunctivae normal.     Pupils: Pupils are equal, round, and reactive to light.  Neck:     Thyroid: No thyroid mass or thyromegaly.     Vascular: No carotid bruit.     Trachea: Trachea normal.  Cardiovascular:     Rate and Rhythm: Normal rate and regular rhythm.     Heart sounds: Normal heart sounds, S1 normal and S2 normal. No murmur heard.    No gallop.  Pulmonary:     Effort: Pulmonary effort is normal. No respiratory distress.     Breath sounds: Normal breath sounds. No wheezing, rhonchi or rales.  Abdominal:     General: Bowel sounds are normal. There is no distension or abdominal bruit.     Palpations: Abdomen is soft. There is no fluid wave or mass.     Tenderness: There is no abdominal tenderness. There is no guarding or rebound.     Hernia: No hernia is present.  Musculoskeletal:     Cervical back: Normal range of motion and neck supple.  Lymphadenopathy:     Cervical: No cervical adenopathy.  Skin:    General: Skin is warm and dry.     Findings: No rash.  Neurological:     Mental Status: She is alert.     Cranial Nerves: No cranial nerve deficit.     Sensory: No sensory deficit.  Psychiatric:        Mood and Affect: Mood is not anxious or depressed.        Speech:  Speech normal.        Behavior: Behavior normal. Behavior is cooperative.        Judgment: Judgment normal.     Diabetic foot exam: Normal inspection No skin breakdown No calluses  Normal DP pulses Normal sensation to light touch and monofilament Nails normal    Results for orders placed or performed in visit on 10/24/22  TSH  Result Value Ref Range   TSH 0.07 (L) 0.35 - 5.50 uIU/mL  Microalbumin / creatinine urine ratio  Result Value Ref Range   Microalb, Ur 1.8 0.0 - 1.9 mg/dL   Creatinine,U 85.7 mg/dL   Microalb Creat Ratio 2.2 0.0 - 30.0 mg/g  Lipid panel  Result Value Ref Range   Cholesterol 141 0 - 200 mg/dL   Triglycerides 76.0 0.0 - 149.0 mg/dL   HDL 45.80 >39.00 mg/dL   VLDL 15.2 0.0 - 40.0 mg/dL   LDL Cholesterol 80 0 - 99 mg/dL   Total CHOL/HDL Ratio 3    NonHDL 94.72   Comprehensive metabolic panel  Result Value Ref Range   Sodium 142 135 - 145 mEq/L   Potassium 4.5 3.5 - 5.1 mEq/L   Chloride 106 96 - 112 mEq/L   CO2 29 19 - 32 mEq/L   Glucose, Bld 121 (H) 70 - 99 mg/dL   BUN 22 6 - 23 mg/dL   Creatinine, Ser 0.83 0.40 - 1.20 mg/dL   Total Bilirubin 0.6 0.2 - 1.2 mg/dL   Alkaline Phosphatase 50 39 - 117 U/L   AST 21 0 - 37 U/L   ALT 28 0 - 35 U/L   Total Protein 6.2 6.0 - 8.3 g/dL   Albumin 3.9 3.5 - 5.2 g/dL   GFR 70.89 >60.00 mL/min   Calcium 9.6 8.4 - 10.5 mg/dL  Hemoglobin A1c  Result Value Ref Range   Hgb A1c MFr Bld 7.1 (H) 4.6 - 6.5 %  T3, Free  Result Value Ref Range   T3, Free 3.1 2.3 - 4.2 pg/mL  T4, free  Result Value Ref Range   Free T4 1.10 0.60 - 1.60 ng/dL    This visit occurred during the SARS-CoV-2 public health emergency.  Safety protocols were in place, including screening questions prior to the visit, additional usage of staff PPE, and extensive cleaning of exam room while observing appropriate contact time as indicated for disinfecting solutions.   COVID 19 screen:  No recent travel or known exposure to COVID19 The  patient denies respiratory symptoms of COVID 19 at this time. The importance of social distancing was discussed today.   Assessment and Plan The patient's preventative maintenance and recommended screening tests for an annual wellness exam were reviewed in full today. Brought up to date unless services declined.  Counselled on the importance of diet, exercise, and its role in overall health and mortality. The patient's FH and SH was reviewed, including their home life, tobacco status, and drug and alcohol status.   Vaccines: COVID, Td, PNA, ( series given before age 26, due for prevnar 20 today, she refuses today) flu shingles uptodate Pap/DVE:  Not indicated Mammo: 07/2022 Bone Density: Normal 05/2020, repeat in 5 years Colon: Jefm Bryant, Dr. Vira Agar, 05/30/2018,  Mother with colon cancer, q 5 years.. due later this year  Smoking Status: none ETOH/ drug [id number]  Hep C:   done   Problem List Items Addressed This Visit   None  Eliezer Lofts, MD

## 2022-11-19 ENCOUNTER — Other Ambulatory Visit: Payer: Self-pay | Admitting: Family Medicine

## 2022-11-19 DIAGNOSIS — E782 Mixed hyperlipidemia: Secondary | ICD-10-CM

## 2022-11-19 DIAGNOSIS — E119 Type 2 diabetes mellitus without complications: Secondary | ICD-10-CM

## 2022-12-07 ENCOUNTER — Other Ambulatory Visit: Payer: Self-pay | Admitting: Family Medicine

## 2022-12-14 DIAGNOSIS — S92352A Displaced fracture of fifth metatarsal bone, left foot, initial encounter for closed fracture: Secondary | ICD-10-CM | POA: Diagnosis not present

## 2022-12-14 DIAGNOSIS — M79672 Pain in left foot: Secondary | ICD-10-CM | POA: Diagnosis not present

## 2022-12-14 DIAGNOSIS — M7989 Other specified soft tissue disorders: Secondary | ICD-10-CM | POA: Diagnosis not present

## 2022-12-21 ENCOUNTER — Other Ambulatory Visit: Payer: Self-pay | Admitting: Family Medicine

## 2022-12-21 DIAGNOSIS — M79672 Pain in left foot: Secondary | ICD-10-CM | POA: Diagnosis not present

## 2022-12-21 DIAGNOSIS — E119 Type 2 diabetes mellitus without complications: Secondary | ICD-10-CM | POA: Diagnosis not present

## 2022-12-21 DIAGNOSIS — S92355A Nondisplaced fracture of fifth metatarsal bone, left foot, initial encounter for closed fracture: Secondary | ICD-10-CM | POA: Diagnosis not present

## 2022-12-26 ENCOUNTER — Encounter: Payer: Self-pay | Admitting: Podiatry

## 2022-12-26 ENCOUNTER — Other Ambulatory Visit: Payer: Self-pay | Admitting: Podiatry

## 2022-12-26 DIAGNOSIS — M79672 Pain in left foot: Secondary | ICD-10-CM | POA: Diagnosis not present

## 2022-12-26 DIAGNOSIS — S92355A Nondisplaced fracture of fifth metatarsal bone, left foot, initial encounter for closed fracture: Secondary | ICD-10-CM | POA: Diagnosis not present

## 2022-12-26 DIAGNOSIS — E119 Type 2 diabetes mellitus without complications: Secondary | ICD-10-CM | POA: Diagnosis not present

## 2022-12-26 NOTE — Anesthesia Preprocedure Evaluation (Signed)
Anesthesia Evaluation  Patient identified by MRN, date of birth, ID band Patient awake    Reviewed: Allergy & Precautions, H&P , NPO status , Patient's Chart, lab work & pertinent test results  Airway Mallampati: II  TM Distance: >3 FB Neck ROM: Full    Dental no notable dental hx.    Pulmonary neg pulmonary ROS   Pulmonary exam normal breath sounds clear to auscultation       Cardiovascular hypertension, Normal cardiovascular exam Rhythm:Regular Rate:Normal     Neuro/Psych        Patient is very anxious today, afraid of needles, attempted to allay fearsnegative neurological ROS  negative psych ROS   GI/Hepatic negative GI ROS, Neg liver ROS,,,  Endo/Other  diabetes, Type 2, Oral Hypoglycemic AgentsHypothyroidism    Renal/GU negative Renal ROS  negative genitourinary   Musculoskeletal negative musculoskeletal ROS (+)    Abdominal   Peds negative pediatric ROS (+)  Hematology negative hematology ROS (+)   Anesthesia Other Findings Diabetes mellitus without complication (HCC) Hypertension Hyperlipidemia  Thyroid disease Cancer (HCC)     Reproductive/Obstetrics negative OB ROS                             Anesthesia Physical Anesthesia Plan  ASA: 3  Anesthesia Plan: General   Post-op Pain Management: Regional block, Minimal or no pain anticipated and Oxycodone PO   Induction: Intravenous  PONV Risk Score and Plan:   Airway Management Planned: LMA  Additional Equipment:   Intra-op Plan:   Post-operative Plan: Extubation in OR  Informed Consent: I have reviewed the patients History and Physical, chart, labs and discussed the procedure including the risks, benefits and alternatives for the proposed anesthesia with the patient or authorized representative who has indicated his/her understanding and acceptance.     Dental Advisory Given  Plan Discussed with: Anesthesiologist,  CRNA and Surgeon  Anesthesia Plan Comments: (Patient consented for risks of anesthesia including but not limited to:  - adverse reactions to medications - damage to eyes, teeth, lips or other oral mucosa - nerve damage due to positioning  - sore throat or hoarseness - Damage to heart, brain, nerves, lungs, other parts of body or loss of life  Patient voiced understanding.)       Anesthesia Quick Evaluation

## 2022-12-28 ENCOUNTER — Other Ambulatory Visit: Payer: Self-pay | Admitting: Podiatry

## 2022-12-29 ENCOUNTER — Other Ambulatory Visit: Payer: Self-pay

## 2022-12-29 ENCOUNTER — Encounter: Payer: Self-pay | Admitting: Podiatry

## 2022-12-29 ENCOUNTER — Ambulatory Visit
Admission: RE | Admit: 2022-12-29 | Discharge: 2022-12-29 | Disposition: A | Discharge status: Home / Self Care | Payer: PPO | Source: Ambulatory Visit | Attending: Podiatry | Admitting: Podiatry

## 2022-12-29 ENCOUNTER — Encounter
Admission: RE | Disposition: A | Discharge status: Home / Self Care | Payer: Self-pay | Source: Ambulatory Visit | Attending: Podiatry

## 2022-12-29 ENCOUNTER — Ambulatory Visit: Payer: PPO | Admitting: Anesthesiology

## 2022-12-29 ENCOUNTER — Ambulatory Visit: Payer: Self-pay

## 2022-12-29 DIAGNOSIS — E119 Type 2 diabetes mellitus without complications: Secondary | ICD-10-CM | POA: Diagnosis not present

## 2022-12-29 DIAGNOSIS — S92352D Displaced fracture of fifth metatarsal bone, left foot, subsequent encounter for fracture with routine healing: Secondary | ICD-10-CM

## 2022-12-29 DIAGNOSIS — E039 Hypothyroidism, unspecified: Secondary | ICD-10-CM | POA: Insufficient documentation

## 2022-12-29 DIAGNOSIS — X58XXXA Exposure to other specified factors, initial encounter: Secondary | ICD-10-CM | POA: Diagnosis not present

## 2022-12-29 DIAGNOSIS — I10 Essential (primary) hypertension: Secondary | ICD-10-CM | POA: Diagnosis not present

## 2022-12-29 DIAGNOSIS — G8918 Other acute postprocedural pain: Secondary | ICD-10-CM | POA: Diagnosis not present

## 2022-12-29 DIAGNOSIS — Z7984 Long term (current) use of oral hypoglycemic drugs: Secondary | ICD-10-CM | POA: Insufficient documentation

## 2022-12-29 DIAGNOSIS — S92352A Displaced fracture of fifth metatarsal bone, left foot, initial encounter for closed fracture: Secondary | ICD-10-CM | POA: Diagnosis not present

## 2022-12-29 DIAGNOSIS — E785 Hyperlipidemia, unspecified: Secondary | ICD-10-CM | POA: Insufficient documentation

## 2022-12-29 HISTORY — PX: ORIF TOE FRACTURE: SHX5032

## 2022-12-29 LAB — GLUCOSE, CAPILLARY: Glucose-Capillary: 106 mg/dL — ABNORMAL HIGH (ref 70–99)

## 2022-12-29 SURGERY — OPEN REDUCTION INTERNAL FIXATION (ORIF) METATARSAL (TOE) FRACTURE
Anesthesia: General | Site: Toe | Laterality: Left

## 2022-12-29 MED ORDER — HYDROCODONE-ACETAMINOPHEN 5-325 MG PO TABS: 1.0000 | ORAL_TABLET | Freq: Four times a day (QID) | ORAL | 0 refills | Status: DC | PRN

## 2022-12-29 MED ORDER — ROPIVACAINE HCL 2 MG/ML IJ SOLN
INTRAMUSCULAR | Status: DC | PRN
  Administered 2022-12-29 (×2): 10 mL via PERINEURAL

## 2022-12-29 MED ORDER — MIDAZOLAM HCL 2 MG/2ML IJ SOLN
INTRAMUSCULAR | Status: DC | PRN
  Administered 2022-12-29 (×2): 1 mg via INTRAVENOUS

## 2022-12-29 MED ORDER — MIDAZOLAM HCL 2 MG/2ML IJ SOLN
2.0000 mg | Freq: Once | INTRAMUSCULAR | Status: AC
  Administered 2022-12-29: 2 mg via INTRAVENOUS

## 2022-12-29 MED ORDER — OXYCODONE HCL 5 MG PO TABS: 10.0000 mg | ORAL_TABLET | Freq: Once | ORAL | Status: DC

## 2022-12-29 MED ORDER — AMOXICILLIN-POT CLAVULANATE 875-125 MG PO TABS: 1.0000 | ORAL_TABLET | Freq: Two times a day (BID) | ORAL | 0 refills | Status: DC

## 2022-12-29 MED ORDER — ROPIVACAINE HCL 5 MG/ML IJ SOLN
INTRAMUSCULAR | Status: DC | PRN
  Administered 2022-12-29 (×2): 15 mL via PERINEURAL

## 2022-12-29 MED ORDER — ASPIRIN 81 MG PO TBEC: 81.0000 mg | DELAYED_RELEASE_TABLET | Freq: Two times a day (BID) | ORAL | 0 refills | Status: AC

## 2022-12-29 MED ORDER — DEXAMETHASONE SODIUM PHOSPHATE 10 MG/ML IJ SOLN
INTRAMUSCULAR | Status: DC | PRN
  Administered 2022-12-29 (×2): 5 mg

## 2022-12-29 MED ORDER — LIDOCAINE HCL (CARDIAC) PF 100 MG/5ML IV SOSY
PREFILLED_SYRINGE | INTRAVENOUS | Status: DC | PRN
  Administered 2022-12-29: 40 mg via INTRAVENOUS

## 2022-12-29 MED ORDER — LACTATED RINGERS IV SOLN: INTRAVENOUS | Status: DC | PRN

## 2022-12-29 MED ORDER — FENTANYL CITRATE (PF) 100 MCG/2ML IJ SOLN
100.0000 ug | Freq: Once | INTRAMUSCULAR | Status: AC
  Administered 2022-12-29: 100 ug via INTRAVENOUS

## 2022-12-29 MED ORDER — 0.9 % SODIUM CHLORIDE (POUR BTL) OPTIME
TOPICAL | Status: DC | PRN
  Administered 2022-12-29: 500 mL

## 2022-12-29 MED ORDER — PROPOFOL 10 MG/ML IV BOLUS
INTRAVENOUS | Status: DC | PRN
  Administered 2022-12-29: 150 mg via INTRAVENOUS

## 2022-12-29 MED ORDER — CEFAZOLIN SODIUM-DEXTROSE 2-4 GM/100ML-% IV SOLN: 2.0000 g | INTRAVENOUS | Status: DC

## 2022-12-29 MED ORDER — ONDANSETRON HCL 4 MG/2ML IJ SOLN
INTRAMUSCULAR | Status: DC | PRN
  Administered 2022-12-29: 4 mg via INTRAVENOUS

## 2022-12-29 SURGICAL SUPPLY — 31 items
BIT DRILL CANN 3.2X180 (DRILL) IMPLANT
BNDG CMPR STD VLCR NS LF 5.8X4 (GAUZE/BANDAGES/DRESSINGS) ×1
BNDG CMPR STD VLCR NS LF 5.8X6 (GAUZE/BANDAGES/DRESSINGS) ×1
BNDG ELASTIC 4X5.8 VLCR NS LF (GAUZE/BANDAGES/DRESSINGS) ×1 IMPLANT
BNDG ELASTIC 6X5.8 VLCR NS LF (GAUZE/BANDAGES/DRESSINGS) ×1 IMPLANT
BNDG ESMARCH 4 X 12 STRL LF (GAUZE/BANDAGES/DRESSINGS) ×1
BNDG ESMARCH 4X12 STRL LF (GAUZE/BANDAGES/DRESSINGS) IMPLANT
BNDG GAUZE DERMACEA FLUFF 4 (GAUZE/BANDAGES/DRESSINGS) ×1 IMPLANT
BNDG GZE DERMACEA 4 6PLY (GAUZE/BANDAGES/DRESSINGS) ×1
CANISTER SUCT 1200ML W/VALVE (MISCELLANEOUS) ×1 IMPLANT
COVER LIGHT HANDLE UNIVERSAL (MISCELLANEOUS) ×2 IMPLANT
DRILL CANN 3.2X180 (DRILL) ×1
DURAPREP 26ML APPLICATOR (WOUND CARE) ×2 IMPLANT
ELECT REM PT RETURN 9FT ADLT (ELECTROSURGICAL) ×1
ELECTRODE REM PT RTRN 9FT ADLT (ELECTROSURGICAL) ×1 IMPLANT
GAUZE SPONGE 4X4 12PLY STRL (GAUZE/BANDAGES/DRESSINGS) ×1 IMPLANT
GAUZE XEROFORM 1X8 LF (GAUZE/BANDAGES/DRESSINGS) ×1 IMPLANT
GLOVE BIOGEL PI IND STRL 7.5 (GLOVE) ×1 IMPLANT
GLOVE SURG SS PI 7.0 STRL IVOR (GLOVE) ×1 IMPLANT
GOWN STRL REUS W/ TWL LRG LVL3 (GOWN DISPOSABLE) ×2 IMPLANT
GOWN STRL REUS W/TWL LRG LVL3 (GOWN DISPOSABLE) ×2
KIT TURNOVER KIT A (KITS) ×1 IMPLANT
NS IRRIG 500ML POUR BTL (IV SOLUTION) ×1 IMPLANT
PADDING CAST BLEND 4X4 NS (MISCELLANEOUS) ×3 IMPLANT
SCREW CANN 4.5X54 (Screw) IMPLANT
SPLINT CAST 1 STEP 4X30 (MISCELLANEOUS) ×1 IMPLANT
STIMULATOR BONE (ORTHOPEDIC SUPPLIES) ×1
STIMULATOR BONE GROWTH EMG EXT (ORTHOPEDIC SUPPLIES) IMPLANT
STOCKINETTE ORTHO 6X25 (MISCELLANEOUS) ×1 IMPLANT
SUT ETHILON 3-0 (SUTURE) IMPLANT
WIRE 1.5 NITINOL (MISCELLANEOUS) IMPLANT

## 2022-12-29 NOTE — Discharge Instructions (Addendum)
[facility]  POST OPERATIVE INSTRUCTIONS FOR DR. Ether Griffins AND DR. BAKER [facility] POD[facility]RY DEPARTMENT   Take your medication as prescribed.  Pain medication should be taken only as needed.  If pain is mild to moderate would recommend taking Tylenol.  If severe take pain medication as prescribed.  If pain is still severe after taking pain medication may take Tylenol between doses.  If still severe may take 1 pain tablet every 4 hours.  If still severe try taking 2 pain tablets every 6 hours.  If still severe try taking 2 pain tablets every 4 hours.  May also loosen the Ace wrap and reapply less tight.  If still severe contact physician.  Keep the dressing clean, dry and intact.  Remain nonweightbearing at all times left lower extremity for the next 2 weeks.  Keep your foot elevated above the heart level for the first 48 hours.  Continue elevation thereafter to improve swelling.  May apply ice to the top of the left foot or behind the left knee for maximum 10 minutes out of every 1 hour as needed.  Walking to the bathroom and brief periods of walking are acceptable, unless we have instructed you to be non-weight bearing.  Always use crutches, knee scooter, wheelchair to get around to stay off of the left foot.  Do not take a shower. Baths are permissible as long as the foot is kept out of the water.  Keep dressing clean, dry, and intact until next visit.  Begin taking aspirin 81 mg twice daily starting tomorrow  Use bone stimulator that was applied today 3 hours today.  May use when sleeping or resting.  Continue vitamin D supplementation over-the-counter.  Every hour you are awake:  Bend your knee 15 times. Massage calf 15 times  Call [facility] [phone]) if any of the following problems occur: You develop a temperature or fever. The bandage becomes saturated with blood. Medication does not stop your pain. Injury of the foot  occurs. Any symptoms of infection including redness, odor, or red streaks running from wound.    PERIPHERAL NERVE BLOCK PATIENT INFORMATION  Your surgeon has requested a peripheral nerve block for your surgery. This anesthetic technique provides excellent post-operative pain relief for you in a safe and effective manner. It will also help reduce the risk of nausea and vomiting and allow earlier discharge from the hospital.   The block is performed under sedation with ultrasound guidance prior to your procedure. Due to the sedation, your may or may not remember the block experience. The nerve block will begin to take effect anywhere from 5 to 30 minutes after being administered. You will be transported to the operating room from your surgery after the block is completed.   At the end of surgery, when the anesthesia wears off, you will notice a few things. Your may not be able to move or feel the part of your body targeted by the nerve block. These are normal experiences, and they will disappear as the block wears off.  If you had an interscalene nerve block performed (which is common for shoulder surgery), your voice can be very hoarse and you may feel that you are not able to take as deep a breath as you did before surgery. Some patients may also notice a droopy eyelid on the affected side. These symptoms will resolve once the block wears off.  Pain control: The nerve block technique used is a single injection  that can last anywhere from 1-3 days. The duration of the numbness can vary between individuals. After leaving the hospital, it is important that you begin to take your prescribed pain medication when you start to sense the nerve block wearing off. This will help you avoid unpleasant pain at the time the nerve block wears off, which can sometimes be in the middle of the night. The block will only cover pain in the areas targeted by the nerve block so if you experience surgical pain outside of that  area, please take your prescribed pain medication. Management of the "numb area": After a nerve block, you cannot feel pain, pressure, or temperature in the affected area so there is an increased risk for injury. You should take extra care to protect the affected areas until sensation and movement returns. Please take caution to not come in contact with extremely hot or cold items because you will not be able to sense or protect yourself form the extremes of temperature.  You may experience some persistent numbness after the procedure by most neurological deficits resolve over time and the incidence of serious long term neurological complications attributable to peripheral nerve blocks are relatively uncommon.

## 2022-12-29 NOTE — Transfer of Care (Signed)
Immediate Anesthesia Transfer of Care Note  Patient: Andrea Browning  Procedure(s) Performed: OPEN REDUCTION INTERNAL FIXATION (ORIF) METATARSAL (TOE) FRACTURE (Left: Toe)  Patient Location: PACU  Anesthesia Type:General  Level of Consciousness: awake, alert , and oriented  Airway & Oxygen Therapy: Patient Spontanous Breathing and Patient connected to nasal cannula oxygen  Post-op Assessment: Report given to RN  Post vital signs: Reviewed and stable  Last Vitals:  Vitals Value Taken Time  BP 157/78 12/29/22 1017  Temp    Pulse 57 12/29/22 1019  Resp 10 12/29/22 1019  SpO2 98 % 12/29/22 1019  Vitals shown include unvalidated device data.  Last Pain: zero pain per pt.   See PACU  flow sheet for temp.    Vitals:   12/29/22 0757  TempSrc: Temporal  PainSc: 0-No pain         Complications: No notable events documented.

## 2022-12-29 NOTE — Anesthesia Procedure Notes (Signed)
Anesthesia Regional Block: Adductor canal block   Pre-Anesthetic Checklist: , timeout performed,  Correct Patient, Correct Site, Correct Laterality,  Correct Procedure, Correct Position, site marked,  Risks and benefits discussed,  Surgical consent,  Pre-op evaluation,  At surgeon's request and post-op pain management  Laterality: Left  Prep: chloraprep       Needles:  Injection technique: Single-shot  Needle Type: Echogenic Needle     Needle Length: 9cm  Needle Gauge: 21     Additional Needles:   Procedures:,,,, ultrasound used (permanent image in chart),,    Narrative:  Start time: 12/29/2022 9:00 AM End time: 12/29/2022 9:13 AM Injection made incrementally with aspirations every 5 mL.  Performed by: Personally   Additional Notes: Functioning IV was confirmed and monitors applied. Ultrasound guidance: relevant anatomy identified, needle position confirmed, local anesthetic spread visualized around nerve(s)., vascular puncture avoided.  Image printed for medical record.  Negative aspiration and no paresthesias; incremental administration of local anesthetic. The patient tolerated the procedure well. Vitals signes recorded in RN notes.

## 2022-12-29 NOTE — H&P (Signed)
HISTORY AND PHYSICAL INTERVAL NOTE:  12/29/2022  9:00 AM  Andrea Browning  has presented today for surgery, with the diagnosis of S92.355A - Displaced fracture of fifth metatarsal, LEFT.  The various methods of treatment have been discussed with the patient.  No guarantees were given.  After consideration of risks, benefits and other options for treatment, the patient has consented to surgery.  I have reviewed the patients' chart and labs.    PROCEDURE: LEFT 5TH METATARSAL FRACTURE ORIF  A history and physical examination was performed in my office.  The patient was reexamined.  There have been no changes to this history and physical examination.  Rosetta Posner, DPM

## 2022-12-29 NOTE — Anesthesia Procedure Notes (Signed)
Anesthesia Regional Block: Popliteal block   Pre-Anesthetic Checklist: , timeout performed,  Correct Patient, Correct Site, Correct Laterality,  Correct Procedure, Correct Position, site marked,  Risks and benefits discussed,  Surgical consent,  Pre-op evaluation,  At surgeon's request and post-op pain management  Laterality: Left  Prep: chloraprep       Needles:  Injection technique: Single-shot  Needle Type: Echogenic Needle     Needle Length: 9cm  Needle Gauge: 21     Additional Needles:   Procedures:,,,, ultrasound used (permanent image in chart),,    Narrative:  Start time: 12/29/2022 9:00 AM End time: 12/29/2022 9:13 AM Injection made incrementally with aspirations every 5 mL.  Performed by: Personally   Additional Notes: Functioning IV was confirmed and monitors applied. Ultrasound guidance: relevant anatomy identified, needle position confirmed, local anesthetic spread visualized around nerve(s)., vascular puncture avoided.  Image printed for medical record.  Negative aspiration and no paresthesias; incremental administration of local anesthetic. The patient tolerated the procedure well. Vitals signes recorded in RN notes.

## 2022-12-29 NOTE — Anesthesia Postprocedure Evaluation (Signed)
Anesthesia Post Note  Patient: Andrea Browning  Procedure(s) Performed: OPEN REDUCTION INTERNAL FIXATION (ORIF) METATARSAL (TOE) FRACTURE (Left: Toe)  Patient location during evaluation: PACU Anesthesia Type: General Level of consciousness: awake and alert Pain management: pain level controlled Vital Signs Assessment: post-procedure vital signs reviewed and stable Respiratory status: spontaneous breathing, nonlabored ventilation, respiratory function stable and patient connected to nasal cannula oxygen Cardiovascular status: blood pressure returned to baseline and stable Postop Assessment: no apparent nausea or vomiting Anesthetic complications: no   No notable events documented.   Last Vitals:  Vitals:   12/29/22 1030 12/29/22 1031  BP:  (!) 170/70  Pulse: (!) 57 (!) 59  Resp: 11 12  Temp:    SpO2: 99% 99%    Last Pain:  Vitals:   12/29/22 1030  TempSrc:   PainSc: 0-No pain                 Stellan Vick C Kyandre Okray

## 2022-12-29 NOTE — Op Note (Signed)
PODIATRY / FOOT AND ANKLE SURGERY OPERATIVE REPORT    SURGEON: Rosetta Posner, DPM  PRE-OPERATIVE DIAGNOSIS:  1.  Left fifth metatarsal Jones fracture  POST-OPERATIVE DIAGNOSIS: Same  PROCEDURE(S): Left fifth metatarsal fracture open reduction with internal fixation  HEMOSTASIS: Left ankle tourniquet  ANESTHESIA: MAC  ESTIMATED BLOOD LOSS: 2 cc  FINDING(S): 1.  Left fifth metatarsal base fracture, closed, slightly displaced  PATHOLOGY/SPECIMEN(S): None  INDICATIONS:   Andrea Browning is a 72 y.o. female who presents with a fracture to the left fifth metatarsal base after sustaining an injury.  Patient was seen initially in the walk-in clinic was noted to have this fracture was placed into a boot.  Patient then came into clinic for further evaluation was noted to have a Jones fracture to the area.  Discussed conservative and surgical attempts at correction.  All treatment options were discussed with the patient both conservative and surgical attempts at correction include potential risks and complications at this time patient is elected for surgical intervention consisting of left fifth metatarsal base fracture open reduction with internal fixation.  No guarantees given.  Discussed postoperative great detail.  Patient agreeable and consent obtained.  DESCRIPTION: After obtaining full informed written consent, the patient was brought back to the operating room and placed supine upon the operating table.  The patient received IV antibiotics prior to induction.  After obtaining adequate anesthesia, the patient was prepped and draped in the standard fashion.  A popliteal block was performed by anesthesia preoperatively.  An Esmarch bandage was used to exsanguinate the left lower extremity and the pneumatic ankle tourniquet was inflated.  Attention was directed to the left lateral foot where the fifth metatarsal base was marked out with a marking pen.  Under fluoroscopic guidance then a pin  was then introduced into the lateral foot proximal to the fifth metatarsal base around the calcaneocuboid joint area and introduced into the fifth metatarsal base under fluoroscopic guidance.  Once it appeared to be under the appropriate position in multiple views the pin/guidewire was drilled across the fifth metatarsal base through the fracture and into the fifth metatarsal proximal shaft and into the midshaft.  The wire was then seen under multiple views and fluoroscopic guidance was noted to be in the appropriate orientation.  A small percutaneous incision was made around the wire and dissection was continued down with a hemostat to the fifth metatarsal base area.  The tissue protector was then placed over the wire and the appropriate drill size was used for drilling for 4.5 cannulated Paragon 28 screw.  Utilizing standard AO principles and techniques a 4.5 x 54 mm partially-threaded Paragon 28 screw cannulated was placed.  Excellent compression was noted across the fracture site.  The guidewire was removed.  The screw was then viewed under multiple views utilizing AP, lateral oblique, and lateral foot views and was noted to have excellent compression across the fracture site with the screw the appropriate orientation and size.  The surgical site was flushed with copious amounts normal sterile saline.  The skin was then reapproximated well coapted with 4-0 nylon in a combination of simple and horizontal mattress type stitching.  A postoperative dressing was then applied consisting of Xeroform to the incision followed by 4 x 4 gauze, gauze roll, Webril, posterior splint, Ace wrap.  The pneumatic ankle tourniquet was deflated and a prompt hyperemic response was noted all digits left foot.  Following period of postoperative monitoring the patient be discharged home with the appropriate orders  and instructions, patient is to remain nonweightbearing at all times left lower extremity for the next 2 weeks.  The bone  stimulator was also applied and patient/family was instructed on usage daily.  COMPLICATIONS: None   CONDITION: Good, stable  Rosetta Posner, DPM

## 2023-01-03 ENCOUNTER — Encounter: Payer: Self-pay | Admitting: Podiatry

## 2023-01-04 DIAGNOSIS — M79672 Pain in left foot: Secondary | ICD-10-CM | POA: Diagnosis not present

## 2023-01-04 DIAGNOSIS — S92355D Nondisplaced fracture of fifth metatarsal bone, left foot, subsequent encounter for fracture with routine healing: Secondary | ICD-10-CM | POA: Diagnosis not present

## 2023-01-13 DIAGNOSIS — S92355D Nondisplaced fracture of fifth metatarsal bone, left foot, subsequent encounter for fracture with routine healing: Secondary | ICD-10-CM | POA: Diagnosis not present

## 2023-01-13 DIAGNOSIS — M79672 Pain in left foot: Secondary | ICD-10-CM | POA: Diagnosis not present

## 2023-02-08 DIAGNOSIS — M79672 Pain in left foot: Secondary | ICD-10-CM | POA: Diagnosis not present

## 2023-02-08 DIAGNOSIS — S92355D Nondisplaced fracture of fifth metatarsal bone, left foot, subsequent encounter for fracture with routine healing: Secondary | ICD-10-CM | POA: Diagnosis not present

## 2023-03-06 ENCOUNTER — Other Ambulatory Visit: Payer: Self-pay | Admitting: Family Medicine

## 2023-03-15 DIAGNOSIS — S92355D Nondisplaced fracture of fifth metatarsal bone, left foot, subsequent encounter for fracture with routine healing: Secondary | ICD-10-CM | POA: Diagnosis not present

## 2023-03-30 ENCOUNTER — Encounter (INDEPENDENT_AMBULATORY_CARE_PROVIDER_SITE_OTHER): Payer: Self-pay

## 2023-04-12 ENCOUNTER — Telehealth: Payer: Self-pay | Admitting: *Deleted

## 2023-04-12 DIAGNOSIS — E785 Hyperlipidemia, unspecified: Secondary | ICD-10-CM

## 2023-04-12 DIAGNOSIS — E119 Type 2 diabetes mellitus without complications: Secondary | ICD-10-CM

## 2023-04-12 NOTE — Telephone Encounter (Signed)
-----   Message from Lovena Neighbours sent at 04/11/2023  1:55 PM EDT ----- Regarding: Fasting labs for 9.11.24 Please put lab orders in future. Thank you, Denny Peon

## 2023-04-26 ENCOUNTER — Other Ambulatory Visit (INDEPENDENT_AMBULATORY_CARE_PROVIDER_SITE_OTHER): Payer: PPO

## 2023-04-26 DIAGNOSIS — E1169 Type 2 diabetes mellitus with other specified complication: Secondary | ICD-10-CM

## 2023-04-26 DIAGNOSIS — E119 Type 2 diabetes mellitus without complications: Secondary | ICD-10-CM

## 2023-04-26 DIAGNOSIS — E785 Hyperlipidemia, unspecified: Secondary | ICD-10-CM | POA: Diagnosis not present

## 2023-04-26 LAB — COMPREHENSIVE METABOLIC PANEL
ALT: 14 U/L (ref 0–35)
AST: 14 U/L (ref 0–37)
Albumin: 4.2 g/dL (ref 3.5–5.2)
Alkaline Phosphatase: 58 U/L (ref 39–117)
BUN: 24 mg/dL — ABNORMAL HIGH (ref 6–23)
CO2: 31 meq/L (ref 19–32)
Calcium: 9.9 mg/dL (ref 8.4–10.5)
Chloride: 104 meq/L (ref 96–112)
Creatinine, Ser: 0.84 mg/dL (ref 0.40–1.20)
GFR: 69.63 mL/min (ref >60.00)
Glucose, Bld: 100 mg/dL — ABNORMAL HIGH (ref 70–99)
Potassium: 4.9 meq/L (ref 3.5–5.1)
Sodium: 140 meq/L (ref 135–145)
Total Bilirubin: 0.8 mg/dL (ref 0.2–1.2)
Total Protein: 6.6 g/dL (ref 6.0–8.3)

## 2023-04-26 LAB — LIPID PANEL
Cholesterol: 143 mg/dL (ref 0–200)
HDL: 50.7 mg/dL (ref >39.00)
LDL Cholesterol: 76 mg/dL (ref 0–99)
NonHDL: 92.57
Total CHOL/HDL Ratio: 3
Triglycerides: 84 mg/dL (ref 0.0–149.0)
VLDL: 16.8 mg/dL (ref 0.0–40.0)

## 2023-04-26 LAB — HEMOGLOBIN A1C: Hgb A1c MFr Bld: 6.8 % — ABNORMAL HIGH (ref 4.6–6.5)

## 2023-05-02 ENCOUNTER — Encounter: Payer: Self-pay | Admitting: Family Medicine

## 2023-05-02 ENCOUNTER — Ambulatory Visit (INDEPENDENT_AMBULATORY_CARE_PROVIDER_SITE_OTHER): Payer: PPO | Admitting: Family Medicine

## 2023-05-02 VITALS — BP 120/70 | HR 54 | Temp 98.2°F | Ht 66.35 in | Wt 178.0 lb

## 2023-05-02 DIAGNOSIS — I152 Hypertension secondary to endocrine disorders: Secondary | ICD-10-CM

## 2023-05-02 DIAGNOSIS — E785 Hyperlipidemia, unspecified: Secondary | ICD-10-CM

## 2023-05-02 DIAGNOSIS — M25561 Pain in right knee: Secondary | ICD-10-CM | POA: Insufficient documentation

## 2023-05-02 DIAGNOSIS — Z7984 Long term (current) use of oral hypoglycemic drugs: Secondary | ICD-10-CM | POA: Diagnosis not present

## 2023-05-02 DIAGNOSIS — E119 Type 2 diabetes mellitus without complications: Secondary | ICD-10-CM

## 2023-05-02 DIAGNOSIS — E1169 Type 2 diabetes mellitus with other specified complication: Secondary | ICD-10-CM

## 2023-05-02 DIAGNOSIS — E1159 Type 2 diabetes mellitus with other circulatory complications: Secondary | ICD-10-CM

## 2023-05-02 NOTE — Assessment & Plan Note (Addendum)
Chronic, improved control with lifestyle change.    Trying to get back to walking but right knee pain occ limiting her.   She will continue metformin XR 500 mg p.o. daily but will keep an eye out for hypoglycemia.

## 2023-05-02 NOTE — Assessment & Plan Note (Signed)
Chronic, well controlled ? ?LDL at goal less than 100. ?Continue Crestor 20 mg daily. ?

## 2023-05-02 NOTE — Assessment & Plan Note (Signed)
Acute, normal exam in office today. Use diclofenac topical gel 4 times daily as needed for pain.  Occasional ibuprofen as needed.  Consider glucosamine.

## 2023-05-02 NOTE — Assessment & Plan Note (Signed)
Chronic well-controlled Continue lisinopril 10 mg p.o. daily.

## 2023-05-02 NOTE — Progress Notes (Addendum)
Patient ID: Andrea Browning, female    DOB: 09-01-1950, 72 y.o.   MRN: [id number]  This visit was conducted in person.  BP 120/70 (BP Location: Left Arm, Patient Position: Sitting, Cuff Size: Large)   Pulse (!) 54   Temp 98.2 F (36.8 C) (Temporal)   Ht 5' 6.35" (1.685 m)   Wt 178 lb (80.7 kg)   SpO2 98%   BMI 28.43 kg/m    CC:  Chief Complaint  Patient presents with   Diabetes    Subjective:   HPI: Andrea Browning is a 72 y.o. female presenting on 05/02/2023 for Diabetes 76-month follow-up  Diabetes: Improving control with continued use of metformin XR 500 mg p.o. daily.  She has been working on diet changes... more veggie plates.  No recent steroids or infection. Lab Results  Component Value Date   HGBA1C 6.8 (H) 04/26/2023  Using medications without difficulties: none Hypoglycemic episodes:none Hyperglycemic episodes: none Feet problems: no ulcer Blood Sugars averaging: FBS 107 eye exam within last year:  done 08/2021  Wt Readings from Last 3 Encounters:  05/02/23 178 lb (80.7 kg)  12/29/22 178 lb 6.4 oz (80.9 kg)  10/28/22 179 lb 2 oz (81.3 kg)     Hypertension:   Inadequately controlled on lisinopril 10 mg p.o. daily  In last 4 month since fracture of metatarsal BP Readings from Last 3 Encounters:  05/02/23 120/70  12/29/22 (!) 173/78  10/28/22 120/70  Using medication without problems or lightheadedness:  none Chest pain with exertion: none Edema:none except csome in left foot where injury was Short of breath: none Average home BPs: Other issues:     Elevated Cholesterol:  well controlled on rosuvastatin Lab Results  Component Value Date   CHOL 143 04/26/2023   HDL 50.70 04/26/2023   LDLCALC 76 04/26/2023   TRIG 84.0 04/26/2023   CHOLHDL 3 04/26/2023  Using medications without problems: Muscle aches:  Diet compliance: heart health diet. Exercise:  gardening Other complaints:   Relevant past medical, surgical, family and social  history reviewed and updated as indicated. Interim medical history since our last visit reviewed. Allergies and medications reviewed and updated. Outpatient Medications Prior to Visit  Medication Sig Dispense Refill   betamethasone dipropionate 0.05 % cream Apply 1 application  topically 2 (two) times daily as needed.     Cholecalciferol (VITAMIN D3) 25 MCG (1000 UT) capsule Take 1,000 Units by mouth daily.     levothyroxine (SYNTHROID) 88 MCG tablet TAKE 1 TAB BY MOUTH 1XDAY ON AN EMPTY STOMACH W/GLASS OF WATER AT LEAST 30-60 MINS BEFORE BREAKFAST 90 tablet 3   lisinopril (ZESTRIL) 10 MG tablet TAKE 1 TABLET BY MOUTH EVERY DAY 90 tablet 1   metFORMIN (GLUCOPHAGE-XR) 500 MG 24 hr tablet TAKE 1 TABLET BY MOUTH EVERY DAY WITH DINNER 90 tablet 1   rosuvastatin (CRESTOR) 20 MG tablet TAKE 1 TABLET (20 MG TOTAL) BY MOUTH DAILY. STOP LOVASTATIN. 90 tablet 3   amoxicillin-clavulanate (AUGMENTIN) 875-125 MG tablet Take 1 tablet by mouth 2 (two) times daily. 14 tablet 0   HYDROcodone-acetaminophen (NORCO) 5-325 MG tablet Take 1 tablet by mouth every 6 (six) hours as needed for moderate pain. 28 tablet 0   No facility-administered medications prior to visit.     Per HPI unless specifically indicated in ROS section below Review of Systems  Constitutional:  Negative for fatigue and fever.  HENT:  Negative for congestion.   Eyes:  Negative for pain.  Respiratory:  Negative for cough and shortness of breath.   Cardiovascular:  Negative for chest pain, palpitations and leg swelling.  Gastrointestinal:  Negative for abdominal pain.  Genitourinary:  Negative for dysuria and vaginal bleeding.  Musculoskeletal:  Negative for back pain.  Neurological:  Negative for syncope, light-headedness and headaches.  Psychiatric/Behavioral:  Negative for dysphoric mood.    Objective:  BP 120/70 (BP Location: Left Arm, Patient Position: Sitting, Cuff Size: Large)   Pulse (!) 54   Temp 98.2 F (36.8 C) (Temporal)    Ht 5' 6.35" (1.685 m)   Wt 178 lb (80.7 kg)   SpO2 98%   BMI 28.43 kg/m   Wt Readings from Last 3 Encounters:  05/02/23 178 lb (80.7 kg)  12/29/22 178 lb 6.4 oz (80.9 kg)  10/28/22 179 lb 2 oz (81.3 kg)      Physical Exam Constitutional:      General: She is not in acute distress.    Appearance: Normal appearance. She is well-developed. She is not ill-appearing or toxic-appearing.  HENT:     Head: Normocephalic.     Right Ear: Hearing, tympanic membrane, ear canal and external ear normal. Tympanic membrane is not erythematous, retracted or bulging.     Left Ear: Hearing, tympanic membrane, ear canal and external ear normal. Tympanic membrane is not erythematous, retracted or bulging.     Nose: No mucosal edema or rhinorrhea.     Right Sinus: No maxillary sinus tenderness or frontal sinus tenderness.     Left Sinus: No maxillary sinus tenderness or frontal sinus tenderness.     Mouth/Throat:     Pharynx: Uvula midline.  Eyes:     General: Lids are normal. Lids are everted, no foreign bodies appreciated.     Conjunctiva/sclera: Conjunctivae normal.     Pupils: Pupils are equal, round, and reactive to light.  Neck:     Thyroid: No thyroid mass or thyromegaly.     Vascular: No carotid bruit.     Trachea: Trachea normal.  Cardiovascular:     Rate and Rhythm: Normal rate and regular rhythm.     Pulses: Normal pulses.     Heart sounds: Normal heart sounds, S1 normal and S2 normal. No murmur heard.    No friction rub. No gallop.  Pulmonary:     Effort: Pulmonary effort is normal. No tachypnea or respiratory distress.     Breath sounds: Normal breath sounds. No decreased breath sounds, wheezing, rhonchi or rales.  Abdominal:     General: Bowel sounds are normal.     Palpations: Abdomen is soft.     Tenderness: There is no abdominal tenderness.  Musculoskeletal:     Cervical back: Normal range of motion and neck supple.     Right knee: Normal. No swelling, effusion, erythema or  bony tenderness. Normal range of motion. No tenderness. No LCL laxity, MCL laxity or ACL laxity. Normal meniscus.  Skin:    General: Skin is warm and dry.     Findings: No rash.  Neurological:     Mental Status: She is alert.  Psychiatric:        Mood and Affect: Mood is not anxious or depressed.        Speech: Speech normal.        Behavior: Behavior normal. Behavior is cooperative.        Thought Content: Thought content normal.        Judgment: Judgment normal.       Results for orders  placed or performed in visit on 04/26/23  Lipid panel  Result Value Ref Range   Cholesterol 143 0 - 200 mg/dL   Triglycerides 62.1 0.0 - 149.0 mg/dL   HDL 30.86 >57.84 mg/dL   VLDL 69.6 0.0 - 29.5 mg/dL   LDL Cholesterol 76 0 - 99 mg/dL   Total CHOL/HDL Ratio 3    NonHDL 92.57   Hemoglobin A1c  Result Value Ref Range   Hgb A1c MFr Bld 6.8 (H) 4.6 - 6.5 %  Comprehensive metabolic panel  Result Value Ref Range   Sodium 140 135 - 145 mEq/L   Potassium 4.9 3.5 - 5.1 mEq/L   Chloride 104 96 - 112 mEq/L   CO2 31 19 - 32 mEq/L   Glucose, Bld 100 (H) 70 - 99 mg/dL   BUN 24 (H) 6 - 23 mg/dL   Creatinine, Ser 2.84 0.40 - 1.20 mg/dL   Total Bilirubin 0.8 0.2 - 1.2 mg/dL   Alkaline Phosphatase 58 39 - 117 U/L   AST 14 0 - 37 U/L   ALT 14 0 - 35 U/L   Total Protein 6.6 6.0 - 8.3 g/dL   Albumin 4.2 3.5 - 5.2 g/dL   GFR 13.24 >40.10 mL/min   Calcium 9.9 8.4 - 10.5 mg/dL     COVID 19 screen:  No recent travel or known exposure to COVID19 The patient denies respiratory symptoms of COVID 19 at this time. The importance of social distancing was discussed today.   Assessment and Plan    Problem List Items Addressed This Visit     Acute pain of right knee    Acute, normal exam in office today. Use diclofenac topical gel 4 times daily as needed for pain.  Occasional ibuprofen as needed.  Consider glucosamine.      Controlled type 2 diabetes mellitus without complication, without long-term  current use of insulin (HCC) (Chronic)    Chronic, improved control with lifestyle change.    Trying to get back to walking but right knee pain occ limiting her.   She will continue metformin XR 500 mg p.o. daily but will keep an eye out for hypoglycemia.        Hyperlipidemia associated with type 2 diabetes mellitus (HCC) (Chronic)    Chronic, well controlled  LDL at goal less than 100. Continue Crestor 20 mg daily.      Hypertension associated with diabetes (HCC) - Primary (Chronic)    Chronic well-controlled Continue lisinopril 10 mg p.o. daily.       Return in about 6 months (around 10/30/2023) for phone AMW,  fasting labs then CPE with me.    Kerby Nora, MD

## 2023-06-21 ENCOUNTER — Other Ambulatory Visit: Payer: Self-pay | Admitting: Family Medicine

## 2023-06-21 DIAGNOSIS — E119 Type 2 diabetes mellitus without complications: Secondary | ICD-10-CM

## 2023-07-26 ENCOUNTER — Other Ambulatory Visit: Payer: Self-pay | Admitting: Family Medicine

## 2023-07-26 DIAGNOSIS — Z1231 Encounter for screening mammogram for malignant neoplasm of breast: Secondary | ICD-10-CM

## 2023-08-01 ENCOUNTER — Ambulatory Visit
Admission: RE | Admit: 2023-08-01 | Discharge: 2023-08-01 | Disposition: A | Discharge status: Home / Self Care | Payer: PPO | Source: Ambulatory Visit | Attending: Family Medicine | Admitting: Family Medicine

## 2023-08-01 DIAGNOSIS — Z1231 Encounter for screening mammogram for malignant neoplasm of breast: Secondary | ICD-10-CM | POA: Diagnosis not present

## 2023-09-11 IMAGING — MG MM DIGITAL SCREENING BILAT W/ TOMO AND CAD
6 of 10 series · 6 of 30 positions shown · non-contrast
Comparison: Previous exam(s).

CLINICAL DATA: Screening.

EXAM:
DIGITAL SCREENING BILATERAL MAMMOGRAM WITH TOMOSYNTHESIS AND CAD
TECHNIQUE: Bilateral screening digital craniocaudal and mediolateral oblique
mammograms were obtained. Bilateral screening digital breast
tomosynthesis was performed. The images were evaluated with
computer-aided detection.

[R MLO synth-2D (1 of 2)]
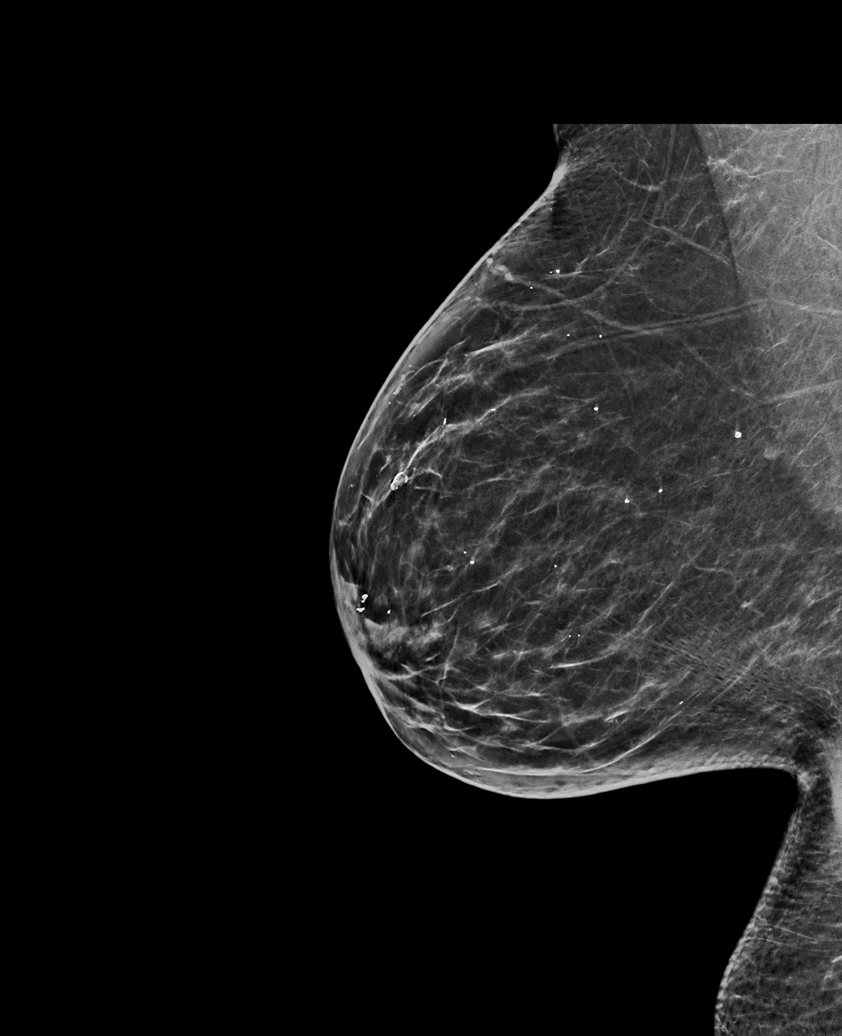

[R MLO synth-2D (2 of 2)]
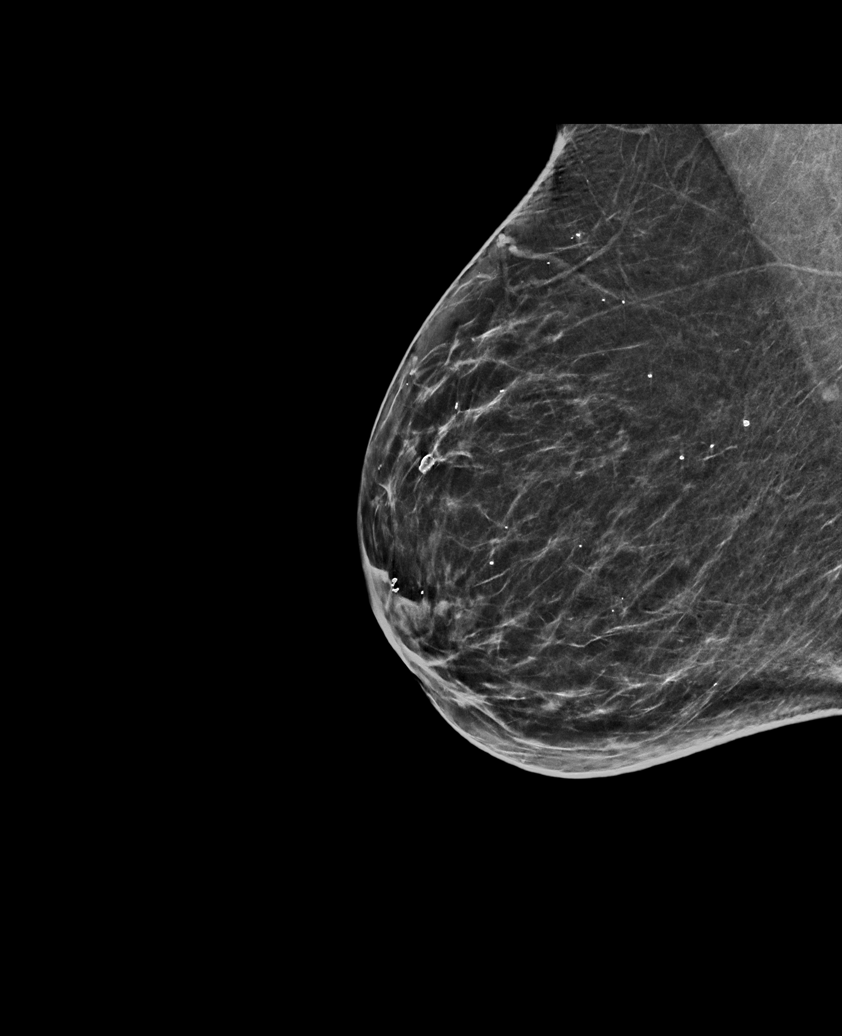

[L CC synth-2D]
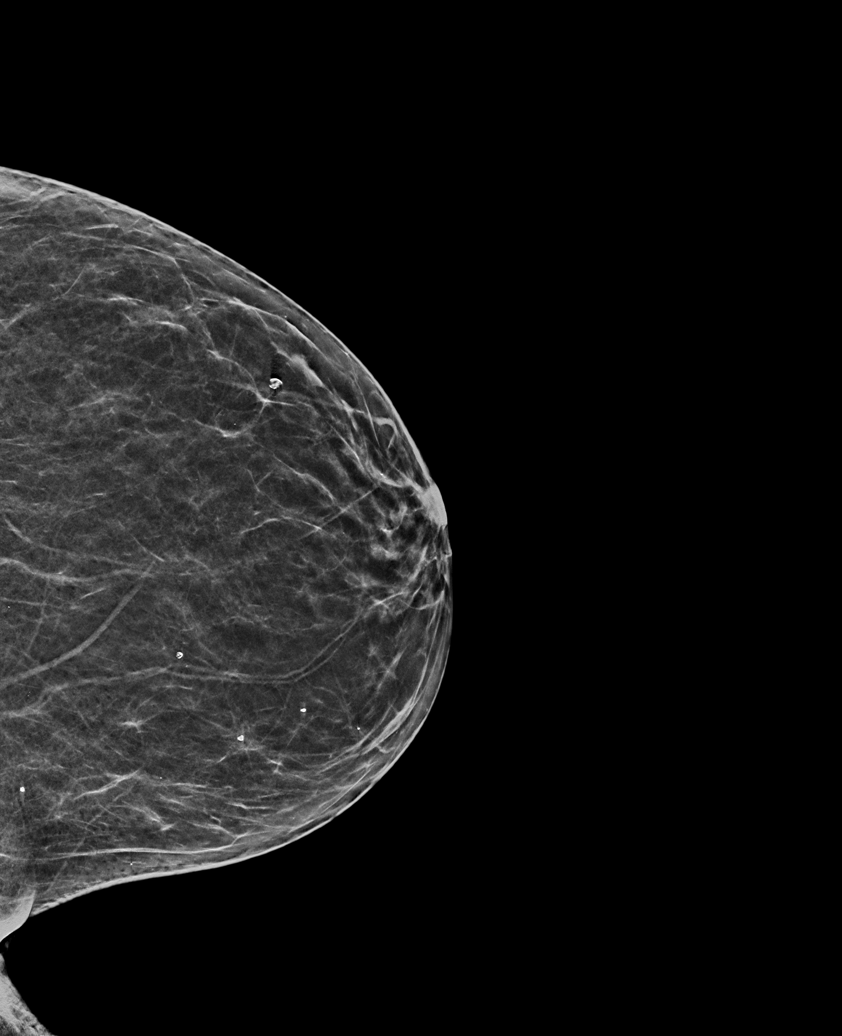

[L MLO synth-2D]
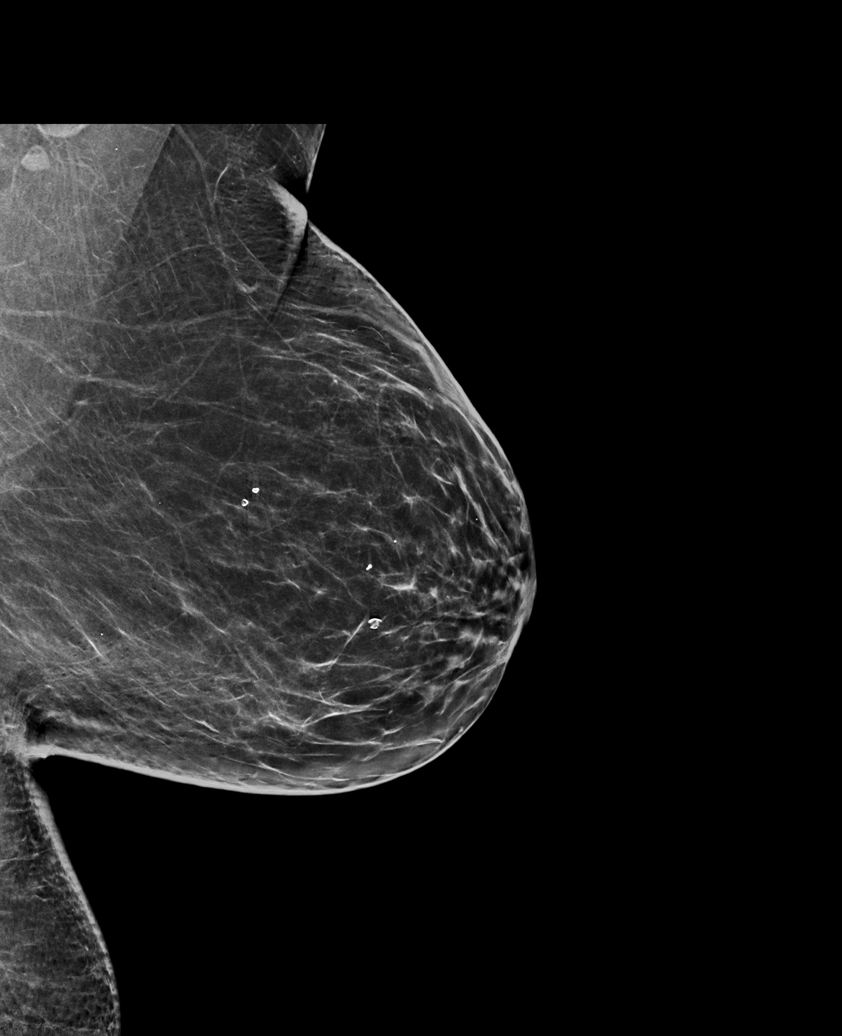

[R CC synth-2D]
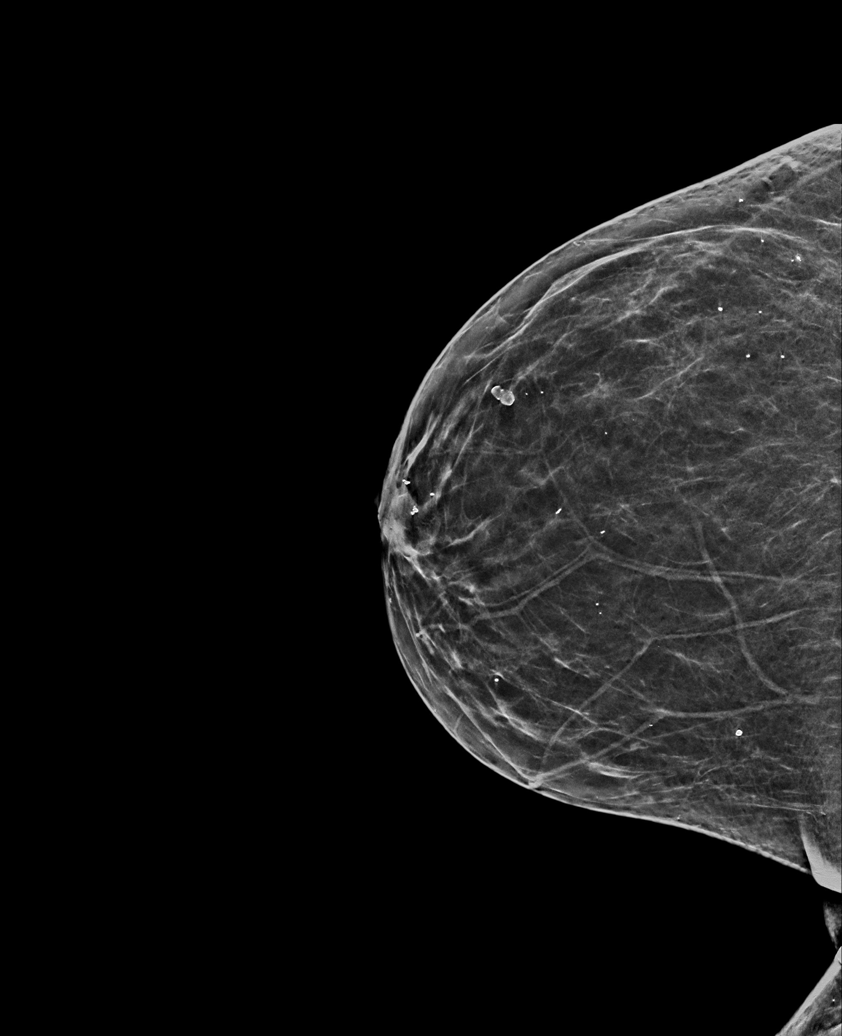

[R MLO tomo · tomo slice 32/63.0]
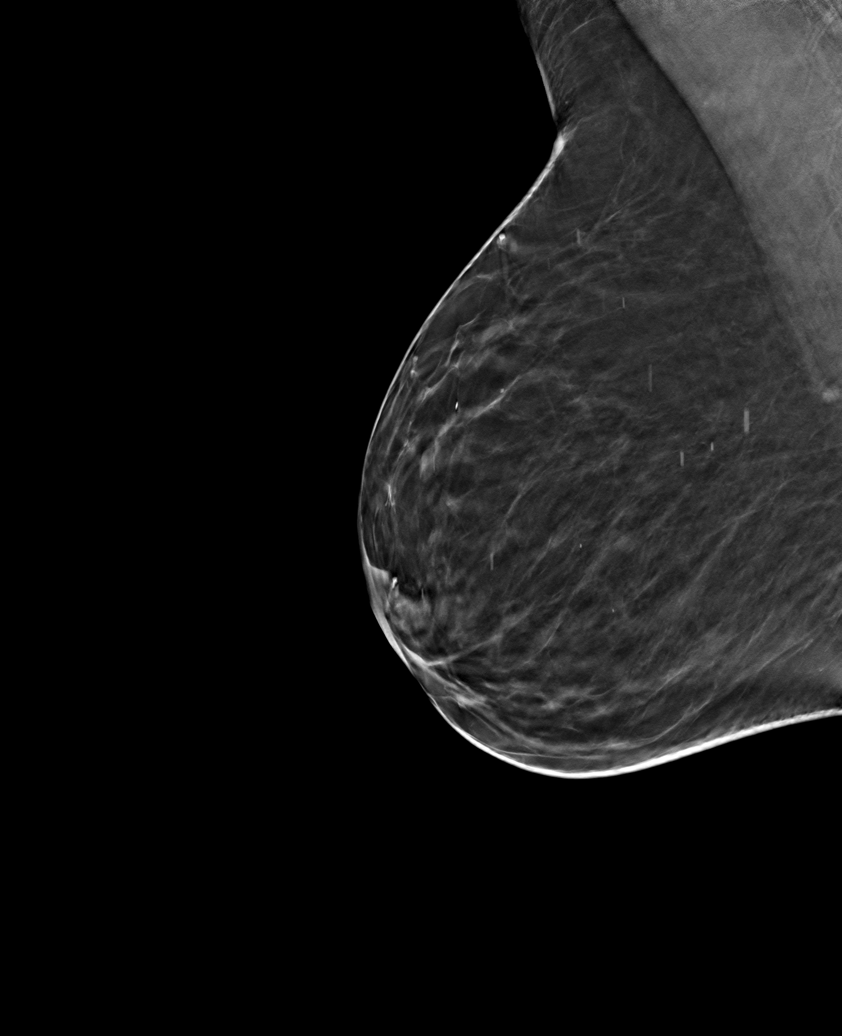

[6 of 30 positions shown; findings below may reference images not displayed]

ACR Breast Density Category b: There are scattered areas of
fibroglandular density.
FINDINGS: There are no findings suspicious for malignancy.
IMPRESSION: No mammographic evidence of malignancy. A result letter of this
screening mammogram will be mailed directly to the patient.

RECOMMENDATION:
Screening mammogram in one year. (Code:51-O-LD2)

BI-RADS CATEGORY  1: Negative.

## 2023-10-09 ENCOUNTER — Telehealth: Payer: Self-pay | Admitting: *Deleted

## 2023-10-09 DIAGNOSIS — E119 Type 2 diabetes mellitus without complications: Secondary | ICD-10-CM | POA: Diagnosis not present

## 2023-10-09 DIAGNOSIS — E039 Hypothyroidism, unspecified: Secondary | ICD-10-CM

## 2023-10-09 DIAGNOSIS — E1169 Type 2 diabetes mellitus with other specified complication: Secondary | ICD-10-CM

## 2023-10-09 DIAGNOSIS — H2513 Age-related nuclear cataract, bilateral: Secondary | ICD-10-CM | POA: Diagnosis not present

## 2023-10-09 LAB — HM DIABETES EYE EXAM

## 2023-10-09 NOTE — Telephone Encounter (Signed)
-----   Message from Lovena Neighbours sent at 10/09/2023 10:23 AM EST ----- Regarding: Labs for Tuesday 3.11.25 Please put physical lab orders in future. Thank you, Denny Peon

## 2023-10-16 ENCOUNTER — Other Ambulatory Visit: Payer: Self-pay | Admitting: Family Medicine

## 2023-10-16 DIAGNOSIS — E782 Mixed hyperlipidemia: Secondary | ICD-10-CM

## 2023-10-16 DIAGNOSIS — E119 Type 2 diabetes mellitus without complications: Secondary | ICD-10-CM

## 2023-10-24 ENCOUNTER — Encounter: Payer: Self-pay | Admitting: Family Medicine

## 2023-10-24 ENCOUNTER — Other Ambulatory Visit (INDEPENDENT_AMBULATORY_CARE_PROVIDER_SITE_OTHER): Payer: PPO

## 2023-10-24 DIAGNOSIS — E039 Hypothyroidism, unspecified: Secondary | ICD-10-CM | POA: Diagnosis not present

## 2023-10-24 DIAGNOSIS — E1169 Type 2 diabetes mellitus with other specified complication: Secondary | ICD-10-CM

## 2023-10-24 DIAGNOSIS — E785 Hyperlipidemia, unspecified: Secondary | ICD-10-CM | POA: Diagnosis not present

## 2023-10-24 DIAGNOSIS — E119 Type 2 diabetes mellitus without complications: Secondary | ICD-10-CM

## 2023-10-24 LAB — COMPREHENSIVE METABOLIC PANEL
ALT: 17 U/L (ref 0–35)
AST: 17 U/L (ref 0–37)
Albumin: 4.3 g/dL (ref 3.5–5.2)
Alkaline Phosphatase: 61 U/L (ref 39–117)
BUN: 19 mg/dL (ref 6–23)
CO2: 32 meq/L (ref 19–32)
Calcium: 9.6 mg/dL (ref 8.4–10.5)
Chloride: 105 meq/L (ref 96–112)
Creatinine, Ser: 0.87 mg/dL (ref 0.40–1.20)
GFR: 66.53 mL/min (ref >60.00)
Glucose, Bld: 125 mg/dL — ABNORMAL HIGH (ref 70–99)
Potassium: 4.6 meq/L (ref 3.5–5.1)
Sodium: 142 meq/L (ref 135–145)
Total Bilirubin: 0.6 mg/dL (ref 0.2–1.2)
Total Protein: 6.4 g/dL (ref 6.0–8.3)

## 2023-10-24 LAB — LIPID PANEL
Cholesterol: 131 mg/dL (ref 0–200)
HDL: 43 mg/dL (ref >39.00)
LDL Cholesterol: 75 mg/dL (ref 0–99)
NonHDL: 87.92
Total CHOL/HDL Ratio: 3
Triglycerides: 63 mg/dL (ref 0.0–149.0)
VLDL: 12.6 mg/dL (ref 0.0–40.0)

## 2023-10-24 LAB — T3, FREE: T3, Free: 2.6 pg/mL (ref 2.3–4.2)

## 2023-10-24 LAB — MICROALBUMIN / CREATININE URINE RATIO
Creatinine,U: 62.1 mg/dL
Microalb Creat Ratio: 11.3 mg/g (ref 0.0–30.0)
Microalb, Ur: <0.7 mg/dL (ref 0.0–1.9)

## 2023-10-24 LAB — T4, FREE: Free T4: 1.11 ng/dL (ref 0.60–1.60)

## 2023-10-24 LAB — HEMOGLOBIN A1C: Hgb A1c MFr Bld: 7.1 % — ABNORMAL HIGH (ref 4.6–6.5)

## 2023-10-24 LAB — TSH: TSH: 0.09 u[IU]/mL — ABNORMAL LOW (ref 0.35–5.50)

## 2023-10-24 NOTE — Progress Notes (Signed)
 No critical labs need to be addressed urgently. We will discuss labs in detail at upcoming office visit.

## 2023-10-25 ENCOUNTER — Ambulatory Visit (INDEPENDENT_AMBULATORY_CARE_PROVIDER_SITE_OTHER): Payer: PPO

## 2023-10-25 VITALS — Ht 66.35 in | Wt 178.0 lb

## 2023-10-25 DIAGNOSIS — Z1211 Encounter for screening for malignant neoplasm of colon: Secondary | ICD-10-CM

## 2023-10-25 DIAGNOSIS — Z0000 Encounter for general adult medical examination without abnormal findings: Secondary | ICD-10-CM | POA: Diagnosis not present

## 2023-10-25 NOTE — Progress Notes (Signed)
 Subjective:   Andrea Browning is a 73 y.o. who presents for a Medicare Wellness preventive visit.  Visit Complete: Virtual I connected with  Shanon Ace on 10/25/23 by a audio enabled telemedicine application and verified that I am speaking with the correct person using two identifiers.  Patient Location: Home  Provider Location: Home Office  I discussed the limitations of evaluation and management by telemedicine. The patient expressed understanding and agreed to proceed.  Vital Signs: Because this visit was a virtual/telehealth visit, some criteria may be missing or patient reported. Any vitals not documented were not able to be obtained and vitals that have been documented are patient reported.  VideoDeclined- This patient declined Librarian, academic. Therefore the visit was completed with audio only.  AWV Questionnaire: No: Patient Medicare AWV questionnaire was not completed prior to this visit.  Cardiac Risk Factors include: advanced age (>7men, >28 women);diabetes mellitus;dyslipidemia;hypertension     Objective:    Today's Vitals   10/25/23 0810 10/25/23 0811  Weight: 178 lb (80.7 kg)   Height: 5' 6.35" (1.685 m)   PainSc:  1    Body mass index is 28.43 kg/m.     10/25/2023    8:26 AM 12/29/2022    7:42 AM 10/20/2022    8:10 AM 10/18/2021    8:18 AM 10/16/2020    8:09 AM 09/06/2019    2:03 PM 05/30/2018    7:54 AM  Advanced Directives  Does Patient Have a Medical Advance Directive? No No No No No No No  Does patient want to make changes to medical advance directive?     No - Patient declined    Would patient like information on creating a medical advance directive?  No - Patient declined No - Patient declined Yes (MAU/Ambulatory/Procedural Areas - Information given)  No - Patient declined No - Patient declined    Current Medications (verified) Outpatient Encounter Medications as of 10/25/2023  Medication Sig   betamethasone  dipropionate 0.05 % cream Apply 1 application  topically 2 (two) times daily as needed.   Cholecalciferol (VITAMIN D3) 25 MCG (1000 UT) capsule Take 1,000 Units by mouth daily.   levothyroxine (SYNTHROID) 88 MCG tablet TAKE 1 TAB BY MOUTH 1XDAY ON AN EMPTY STOMACH W/GLASS OF WATER AT LEAST 30-60 MINS BEFORE BREAKFAST   lisinopril (ZESTRIL) 10 MG tablet TAKE 1 TABLET BY MOUTH EVERY DAY   metFORMIN (GLUCOPHAGE-XR) 500 MG 24 hr tablet TAKE 1 TABLET BY MOUTH EVERY DAY WITH DINNER   rosuvastatin (CRESTOR) 20 MG tablet TAKE 1 TABLET (20 MG TOTAL) BY MOUTH DAILY. STOP LOVASTATIN.   No facility-administered encounter medications on file as of 10/25/2023.    Allergies (verified) Patient has no known allergies.   History: Past Medical History:  Diagnosis Date   Cancer (HCC)    Skin   Diabetes mellitus without complication (HCC)    Hyperlipidemia    Hypertension    Thyroid disease    Past Surgical History:  Procedure Laterality Date   COLONOSCOPY WITH PROPOFOL N/A 05/30/2018   Procedure: COLONOSCOPY WITH PROPOFOL;  Surgeon: Scot Jun, MD;  Location: [facility] ENDOSCOPY;  Service: Endoscopy;  Laterality: N/A;   DILATION AND CURETTAGE OF UTERUS     ORIF TOE FRACTURE Left 12/29/2022   Procedure: OPEN REDUCTION INTERNAL FIXATION (ORIF) METATARSAL (TOE) FRACTURE;  Surgeon: Rosetta Posner, DPM;  Location: [facility];  Service: Podiatry;  Laterality: Left;  Diabetic   WISDOM TOOTH EXTRACTION  Family History  Problem Relation Age of Onset   Breast cancer Maternal Aunt 63   Breast cancer Sister 5   Diabetes Sister    Colon cancer Mother    Arthritis Father    Diabetes Maternal Grandmother    Heart disease Brother    Hyperlipidemia Brother    Heart disease Brother    Hyperlipidemia Brother    Social History   Socioeconomic History   Marital status: Married    Spouse name:  Darryl   Number of children: 2   Years of education: Not on file   Highest education level: Not on  file  Occupational History   Not on file  Tobacco Use   Smoking status: Never   Smokeless tobacco: Never  Vaping Use   Vaping status: Never Used  Substance and Sexual Activity   Alcohol use: Never   Drug use: Never   Sexual activity: Not Currently  Other Topics Concern   Not on file  Social History Narrative   Not on file   Social Drivers of Health   Financial Resource Strain: Low Risk  (10/25/2023)   Overall Financial Resource Strain (CARDIA)    Difficulty of Paying Living Expenses: Not hard at all  Food Insecurity: No Food Insecurity (10/25/2023)   Hunger Vital Sign    Worried About Running Out of Food in the Last Year: Never true    Ran Out of Food in the Last Year: Never true  Transportation Needs: No Transportation Needs (10/25/2023)   PRAPARE - Administrator, Civil Service (Medical): No    Lack of Transportation (Non-Medical): No  Physical Activity: Insufficiently Active (10/25/2023)   Exercise Vital Sign    Days of Exercise per Week: 3 days    Minutes of Exercise per Session: 40 min  Stress: No Stress Concern Present (10/25/2023)   [employer] of Occupational Health - Occupational Stress Questionnaire    Feeling of Stress : Not at all  Social Connections: Moderately Integrated (10/25/2023)   Social Connection and Isolation Panel [NHANES]    Frequency of Communication with Friends and Family: More than three times a week    Frequency of Social Gatherings with Friends and Family: More than three times a week    Attends Religious Services: More than 4 times per year    Active Member of [employer] or Organizations: No    Attends Engineer, structural: Never    Marital Status: Married    Tobacco Counseling Counseling given: Not Answered    Clinical Intake:  Pre-visit preparation completed: Yes  Pain : 0-10 Pain Score: 1  Pain Type: Chronic pain Pain Location: Knee Pain Orientation: Left Pain Descriptors / Indicators: Aching Pain Onset:  More than a month ago Pain Frequency: Intermittent Pain Relieving Factors: analgesic cream, ibu prn  Pain Relieving Factors: analgesic cream, ibu prn  BMI - recorded: 28.43 Nutritional Status: BMI 25 -29 Overweight Nutritional Risks: None Diabetes: Yes CBG done?: Yes (BS 117 this am at home) CBG resulted in Enter/ Edit results?: No Did pt. bring in CBG monitor from home?: No  How often do you need to have someone help you when you read instructions, pamphlets, or other written materials from your doctor or pharmacy?: 1 - Never  Interpreter Needed?: No  Comments: lives with husband Information entered by :: B.Colie Fugitt,LPN   Activities of Daily Living     10/25/2023    8:27 AM 12/29/2022    7:41 AM  In your present state  of health, do you have any difficulty performing the following activities:  Hearing? 0 0  Vision? 0 0  Difficulty concentrating or making decisions? 0 0  Walking or climbing stairs? 0 0  Dressing or bathing? 0 0  Doing errands, shopping? 0   Preparing Food and eating ? N   Using the Toilet? N   In the past six months, have you accidently leaked urine? N   Do you have problems with loss of bowel control? N   Managing your Medications? N   Managing your Finances? N   Housekeeping or managing your Housekeeping? N     Patient Care Team: Excell Seltzer, MD as PCP - General (Family Medicine) Vilinda Flake, [facility] (Inactive) as Pharmacist (Pharmacist)  Indicate any recent Medical Services you may have received from other than [facility] providers in the past year (date may be approximate).     Assessment:   This is a routine wellness examination for Helga.  Hearing/Vision screen Hearing Screening - Comments:: Pt says her hearing is good Vision Screening - Comments:: Pt says her vision is good w/glasses Dr Dellie Burns   Goals Addressed             This Visit's Progress    Patient Stated   Not on track    10/25/23-, I will maintain and eat a healthier  diet.     Patient Stated       10/25/2023, I will maintain and continue medications as prescribed.      Patient Stated   Not on track    10/25/23-Would like to start walking again      Patient Stated   Not on track    10/25/23-Continue to be active and watch what I eat.       Depression Screen     10/25/2023    8:21 AM 10/20/2022    8:08 AM 10/22/2021    9:38 AM 10/18/2021    8:20 AM 10/16/2020    8:11 AM 09/13/2019   10:25 AM 09/06/2019    2:04 PM  PHQ 2/9 Scores  PHQ - 2 Score 0 0 0 0 0 0 0  PHQ- 9 Score     0 0 0    Fall Risk     10/25/2023    8:14 AM 10/20/2022    8:11 AM 10/22/2021    9:38 AM 10/18/2021    8:20 AM 10/16/2020    8:10 AM  Fall Risk   Falls in the past year? 1 0 0 0 0  Number falls in past yr: 0 0 0 0 0  Injury with Fall? 1 0 0 0 0  Comment broke left foot and broke fall w.rt knee: fell chasing a squirrel      Risk for fall due to : No Fall Risks No Fall Risks  No Fall Risks Medication side effect  Follow up Education provided;Falls prevention discussed Falls prevention discussed;Falls evaluation completed Falls evaluation completed Falls prevention discussed Falls evaluation completed;Falls prevention discussed    MEDICARE RISK AT HOME:  Medicare Risk at Home Any stairs in or around the home?: Yes If so, are there any without handrails?: Yes Home free of loose throw rugs in walkways, pet beds, electrical cords, etc?: Yes Adequate lighting in your home to reduce risk of falls?: Yes Life alert?: No Use of a cane, walker or w/c?: No Grab bars in the bathroom?: No Shower chair or bench in shower?: No Elevated toilet seat or a handicapped toilet?:  Yes  TIMED UP AND GO:  Was the test performed?  No  Cognitive Function: 6CIT completed    10/16/2020    8:13 AM 09/06/2019    2:06 PM  MMSE - Mini Mental State Exam  Orientation to time 5 5  Orientation to Place 5 5  Registration 3 3  Attention/ Calculation 5 5  Recall 3 3  Language- repeat 1 1         10/25/2023    8:28 AM 10/20/2022    8:12 AM  6CIT Screen  What Year? 0 points 0 points  What month? 0 points 0 points  What time? 0 points 0 points  Count back from 20 0 points 0 points  Months in reverse 0 points 0 points  Repeat phrase 0 points 4 points  Total Score 0 points 4 points    Immunizations Immunization History  Administered Date(s) Administered   Fluad Quad(high Dose 65+) 05/13/2019, 04/28/2022   Influenza, High Dose Seasonal PF 05/23/2017, 05/11/2018, 05/11/2020   Influenza-Unspecified 05/14/2021   Moderna Sars-Covid-2 Vaccination 12/30/2019, 01/27/2020, 08/11/2020   Pneumococcal Conjugate-13 03/09/2017   Pneumococcal Polysaccharide-23 03/08/2016   Tdap 03/02/2020   Zoster Recombinant(Shingrix) 03/02/2020, 07/14/2020   Zoster, Live 05/31/2013    Screening Tests Health Maintenance  Topic Date Due   COVID-19 Vaccine (4 - 2024-25 season) 04/16/2023   Colonoscopy  05/31/2023   OPHTHALMOLOGY EXAM  10/04/2023   Pneumonia Vaccine 65+ Years old (3 of 3 - PPSV23 or PCV20) 10/28/2023 (Originally 03/08/2021)   INFLUENZA VACCINE  11/13/2023 (Originally 03/16/2023)   FOOT EXAM  10/28/2023   HEMOGLOBIN A1C  04/25/2024   MAMMOGRAM  07/31/2024   Diabetic kidney evaluation - eGFR measurement  10/23/2024   Diabetic kidney evaluation - Urine ACR  10/23/2024   Medicare Annual Wellness (AWV)  10/24/2024   DEXA SCAN  05/28/2025   DTaP/Tdap/Td (2 - Td or Tdap) 03/02/2030   Hepatitis C Screening  Completed   Zoster Vaccines- Shingrix  Completed   HPV VACCINES  Aged Out    Health Maintenance  Health Maintenance Due  Topic Date Due   COVID-19 Vaccine (4 - 2024-25 season) 04/16/2023   Colonoscopy  05/31/2023   OPHTHALMOLOGY EXAM  10/04/2023   Health Maintenance Items Addressed: Referral sent to GI for colonoscopy  Additional Screening:  Vision Screening: Recommended annual ophthalmology exams for early detection of glaucoma and other disorders of the eye.  Dental  Screening: Recommended annual dental exams for proper oral hygiene  Community Resource Referral / Chronic Care Management: CRR required this visit?  No   CCM required this visit?  No     Plan:     I have personally reviewed and noted the following in the patient's chart:   Medical and social history Use of alcohol, tobacco or illicit drugs  Current medications and supplements including opioid prescriptions. Patient is not currently taking opioid prescriptions. Functional ability and status Nutritional status Physical activity Advanced directives List of other physicians Hospitalizations, surgeries, and ER visits in previous 12 months Vitals Screenings to include cognitive, depression, and falls Referrals and appointments  In addition, I have reviewed and discussed with patient certain preventive protocols, quality metrics, and best practice recommendations. A written personalized care plan for preventive services as well as general preventive health recommendations were provided to patient.    Sue Lush, LPN   [id number]/[id number]   After Visit Summary: (MyChart) Due to this being a telephonic visit, the after visit summary with patients personalized plan was  offered to patient via MyChart   Notes: Nothing significant to report at this time.

## 2023-10-25 NOTE — Patient Instructions (Signed)
 Andrea Browning , Thank you for taking time to come for your Medicare Wellness Visit. I appreciate your ongoing commitment to your health goals. Please review the following plan we discussed and let me know if I can assist you in the future.   Referrals/Orders/Follow-Ups/Clinician Recommendations:   [facility], [place]   Phone: [phone]   This is a list of the screening recommended for you and due dates:  Health Maintenance  Topic Date Due   COVID-19 Vaccine (4 - 2024-25 season) 04/16/2023   Colon Cancer Screening  05/31/2023   Eye exam for diabetics  10/04/2023   Pneumonia Vaccine (3 of 3 - PPSV23 or PCV20) 10/28/2023*   Flu Shot  11/13/2023*   Complete foot exam   10/28/2023   Hemoglobin A1C  04/25/2024   Mammogram  07/31/2024   Yearly kidney function blood test for diabetes  10/23/2024   Yearly kidney health urinalysis for diabetes  10/23/2024   Medicare Annual Wellness Visit  10/24/2024   DEXA scan (bone density measurement)  05/28/2025   DTaP/Tdap/Td vaccine (2 - Td or Tdap) 03/02/2030   Hepatitis C Screening  Completed   Zoster (Shingles) Vaccine  Completed   HPV Vaccine  Aged Out  *Topic was postponed. The date shown is not the original due date.    Advanced directives: (Declined) Advance directive discussed with you today. Even though you declined this today, please call our office should you change your mind, and we can give you the proper paperwork for you to fill out.  Next Medicare Annual Wellness Visit scheduled for next year: Yes 10/25/2024 @ 8:50am televisit

## 2023-11-02 ENCOUNTER — Ambulatory Visit: Payer: PPO | Admitting: Family Medicine

## 2023-11-02 ENCOUNTER — Encounter: Payer: Self-pay | Admitting: Family Medicine

## 2023-11-02 VITALS — BP 156/80 | HR 48 | Temp 97.9°F | Ht 65.75 in | Wt 177.1 lb

## 2023-11-02 DIAGNOSIS — E039 Hypothyroidism, unspecified: Secondary | ICD-10-CM | POA: Diagnosis not present

## 2023-11-02 DIAGNOSIS — E1169 Type 2 diabetes mellitus with other specified complication: Secondary | ICD-10-CM | POA: Diagnosis not present

## 2023-11-02 DIAGNOSIS — Z7984 Long term (current) use of oral hypoglycemic drugs: Secondary | ICD-10-CM | POA: Diagnosis not present

## 2023-11-02 DIAGNOSIS — E119 Type 2 diabetes mellitus without complications: Secondary | ICD-10-CM

## 2023-11-02 DIAGNOSIS — Z0000 Encounter for general adult medical examination without abnormal findings: Secondary | ICD-10-CM

## 2023-11-02 DIAGNOSIS — I152 Hypertension secondary to endocrine disorders: Secondary | ICD-10-CM

## 2023-11-02 DIAGNOSIS — E1159 Type 2 diabetes mellitus with other circulatory complications: Secondary | ICD-10-CM

## 2023-11-02 DIAGNOSIS — E785 Hyperlipidemia, unspecified: Secondary | ICD-10-CM | POA: Diagnosis not present

## 2023-11-02 DIAGNOSIS — Z1211 Encounter for screening for malignant neoplasm of colon: Secondary | ICD-10-CM

## 2023-11-02 LAB — HM DIABETES FOOT EXAM

## 2023-11-02 MED ORDER — BLOOD GLUCOSE TEST VI STRP
1.0000 | ORAL_STRIP | Freq: Three times a day (TID) | 0 refills | Status: AC
Start: 2023-11-02 — End: 2023-12-02

## 2023-11-02 MED ORDER — LANCETS MISC. MISC: 1.0000 | Freq: Three times a day (TID) | 0 refills | Status: AC

## 2023-11-02 MED ORDER — BLOOD GLUCOSE MONITORING SUPPL DEVI: 1.0000 | Freq: Three times a day (TID) | 0 refills | Status: DC

## 2023-11-02 NOTE — Assessment & Plan Note (Signed)
Chronic, improved control with lifestyle change.    Trying to get back to walking but right knee pain occ limiting her.   She will continue metformin XR 500 mg p.o. daily but will keep an eye out for hypoglycemia.

## 2023-11-02 NOTE — Assessment & Plan Note (Signed)
Chronic, well controlled ? ?LDL at goal less than 100. ?Continue Crestor 20 mg daily. ?

## 2023-11-02 NOTE — Assessment & Plan Note (Addendum)
 Chronic, well controlled except low TSH.. no S/S of hyperthyroid. Pt wishes to continue at current dose.  Continue levothyroxine 88 mcg daily

## 2023-11-02 NOTE — Progress Notes (Signed)
 Patient ID: Andrea Browning, female    DOB: 12/23/1950, 73 y.o.   MRN: [id number]  This visit was conducted in person.  BP (!) 156/80   Pulse (!) 48   Temp 97.9 F (36.6 C) (Oral)   Ht 5' 5.75" (1.67 m)   Wt 177 lb 2 oz (80.3 kg)   SpO2 97%   BMI 28.81 kg/m    CC:  Chief Complaint  Patient presents with   Annual Exam    MCR prt 2 [AWV- 10/25/23].     Subjective:   HPI: Andrea Browning is a 73 y.o. female presenting on 11/02/2023 for Annual Exam [facility] prt 2 [AWV- 10/25/23].)  The patient presents for  complete physical and review of chronic health problems. He/She also has the following acute concerns today: none  The patient saw a LPN or RN for medicare wellness visit. 10/25/2023  Prevention and wellness was reviewed in detail. Note reviewed and important notes copied below.  Diabetes:    Slight interval worsening on metformin XR 500 mg daily   Lab Results  Component Value Date   HGBA1C 7.1 (H) 10/24/2023  Using medications without difficulties: Hypoglycemic episodes: none Hyperglycemic episodes: none Feet problems: no ulcer Blood Sugars averaging:   not checking lately.. does not have a monitor  other than husbands.. sent in monitor prescription for her. eye exam within last year:  yes  Hypertension:  Above goal in office today on lisinopril 10 mg daily BP Readings from Last 3 Encounters:  11/02/23 (!) 156/80  05/02/23 120/70  12/29/22 (!) 173/78  Using medication without problems or lightheadedness:  one Chest pain with exertion:none Edema: none Short of breath: none Average home BPs: Other issues:  Elevated Cholesterol:  LDL at goal < 100 on crestor 20 mg daily Lab Results  Component Value Date   CHOL 131 10/24/2023   HDL 43.00 10/24/2023   LDLCALC 75 10/24/2023   TRIG 63.0 10/24/2023   CHOLHDL 3 10/24/2023  Using medications without problems: none Muscle aches:  none Diet compliance:  heart healthy diet Exercise: none.. plans to restart in  spring.  Currently gardening. Other complaints:   Hypothyroid  Stable control  of free t3 and free t4 but low TSH on 88 mcg daily Lab Results  Component Value Date   TSH 0.09 (L) 10/24/2023   Wt Readings from Last 3 Encounters:  11/02/23 177 lb 2 oz (80.3 kg)  10/25/23 178 lb (80.7 kg)  05/02/23 178 lb (80.7 kg)        Relevant past medical, surgical, family and social history reviewed and updated as indicated. Interim medical history since our last visit reviewed. Allergies and medications reviewed and updated. Outpatient Medications Prior to Visit  Medication Sig Dispense Refill   betamethasone dipropionate 0.05 % cream Apply 1 application  topically 2 (two) times daily as needed.     Cholecalciferol (VITAMIN D3) 25 MCG (1000 UT) capsule Take 1,000 Units by mouth daily.     levothyroxine (SYNTHROID) 88 MCG tablet TAKE 1 TAB BY MOUTH 1XDAY ON AN EMPTY STOMACH W/GLASS OF WATER AT LEAST 30-60 MINS BEFORE BREAKFAST 90 tablet 3   lisinopril (ZESTRIL) 10 MG tablet TAKE 1 TABLET BY MOUTH EVERY DAY 90 tablet 1   metFORMIN (GLUCOPHAGE-XR) 500 MG 24 hr tablet TAKE 1 TABLET BY MOUTH EVERY DAY WITH DINNER 90 tablet 1   rosuvastatin (CRESTOR) 20 MG tablet TAKE 1 TABLET (20 MG TOTAL) BY MOUTH DAILY. STOP LOVASTATIN. 90 tablet 3  No facility-administered medications prior to visit.     Per HPI unless specifically indicated in ROS section below Review of Systems  Constitutional:  Negative for fatigue and fever.  HENT:  Negative for congestion.   Eyes:  Negative for pain.  Respiratory:  Negative for cough and shortness of breath.   Cardiovascular:  Negative for chest pain, palpitations and leg swelling.  Gastrointestinal:  Negative for abdominal pain.  Genitourinary:  Negative for dysuria and vaginal bleeding.  Musculoskeletal:  Negative for back pain.  Neurological:  Negative for syncope, light-headedness and headaches.  Psychiatric/Behavioral:  Negative for dysphoric mood.     Objective:  BP (!) 156/80   Pulse (!) 48   Temp 97.9 F (36.6 C) (Oral)   Ht 5' 5.75" (1.67 m)   Wt 177 lb 2 oz (80.3 kg)   SpO2 97%   BMI 28.81 kg/m   Wt Readings from Last 3 Encounters:  11/02/23 177 lb 2 oz (80.3 kg)  10/25/23 178 lb (80.7 kg)  05/02/23 178 lb (80.7 kg)      Physical Exam Vitals and nursing note reviewed.  Constitutional:      General: She is not in acute distress.    Appearance: Normal appearance. She is well-developed. She is not ill-appearing or toxic-appearing.  HENT:     Head: Normocephalic.     Right Ear: Hearing, tympanic membrane, ear canal and external ear normal.     Left Ear: Hearing, tympanic membrane, ear canal and external ear normal.     Nose: Nose normal.  Eyes:     General: Lids are normal. Lids are everted, no foreign bodies appreciated.     Conjunctiva/sclera: Conjunctivae normal.     Pupils: Pupils are equal, round, and reactive to light.  Neck:     Thyroid: No thyroid mass or thyromegaly.     Vascular: No carotid bruit.     Trachea: Trachea normal.  Cardiovascular:     Rate and Rhythm: Normal rate and regular rhythm.     Heart sounds: Normal heart sounds, S1 normal and S2 normal. No murmur heard.    No gallop.  Pulmonary:     Effort: Pulmonary effort is normal. No respiratory distress.     Breath sounds: Normal breath sounds. No wheezing, rhonchi or rales.  Abdominal:     General: Bowel sounds are normal. There is no distension or abdominal bruit.     Palpations: Abdomen is soft. There is no fluid wave or mass.     Tenderness: There is no abdominal tenderness. There is no guarding or rebound.     Hernia: No hernia is present.  Musculoskeletal:     Cervical back: Normal range of motion and neck supple.  Lymphadenopathy:     Cervical: No cervical adenopathy.  Skin:    General: Skin is warm and dry.     Findings: No rash.  Neurological:     Mental Status: She is alert.     Cranial Nerves: No cranial nerve deficit.      Sensory: No sensory deficit.  Psychiatric:        Mood and Affect: Mood is not anxious or depressed.        Speech: Speech normal.        Behavior: Behavior normal. Behavior is cooperative.        Judgment: Judgment normal.     Diabetic foot exam: Normal inspection except hammer toe great toe bilaterally No skin breakdown No calluses  Normal DP pulses Normal  sensation to light touch and monofilament Nails normal    Results for orders placed or performed in visit on 11/02/23  HM DIABETES FOOT EXAM   Collection Time: 11/02/23 12:00 AM  Result Value Ref Range   HM Diabetic Foot Exam done     This visit occurred during the SARS-CoV-2 public health emergency.  Safety protocols were in place, including screening questions prior to the visit, additional usage of staff PPE, and extensive cleaning of exam room while observing appropriate contact time as indicated for disinfecting solutions.   COVID 19 screen:  No recent travel or known exposure to COVID19 The patient denies respiratory symptoms of COVID 19 at this time. The importance of social distancing was discussed today.   Assessment and Plan The patient's preventative maintenance and recommended screening tests for an annual wellness exam were reviewed in full today. Brought up to date unless services declined.  Counselled on the importance of diet, exercise, and its role in overall health and mortality. The patient's FH and SH was reviewed, including their home life, tobacco status, and drug and alcohol status.   Vaccines: COVID, Td, PNA, ( series given before age 70, due for prevnar 20 today, she refuses today) flu shingles uptodate Pap/DVE:  Not indicated Mammo: 07/2023 Bone Density: Normal 05/2020, repeat in 5 years Colon: Gavin Potters, Dr. Mechele Collin, 05/30/2018,  Mother with colon cancer, q 5 years. She is unable to do colonoscopy given husbands health... will do iFOB.  Smoking Status: none ETOH/ drug [id number]  Hep C:    done   Problem List Items Addressed This Visit     Controlled type 2 diabetes mellitus without complication, without long-term current use of insulin (HCC) (Chronic)   Chronic, improved control with lifestyle change.    Trying to get back to walking but right knee pain occ limiting her.   She will continue metformin XR 500 mg p.o. daily but will keep an eye out for hypoglycemia.        Hyperlipidemia associated with type 2 diabetes mellitus (HCC) (Chronic)   Chronic, well controlled  LDL at goal less than 100. Continue Crestor 20 mg daily.      Hypertension associated with diabetes (HCC) (Chronic)   Chronic well-controlled in past, above goal in office today... pt will follow at home... call with measurement in 1-2 weeks.  Continue lisinopril 10 mg p.o. daily.       Hypothyroidism (Chronic)   Chronic, well controlled except low TSH.. no S/S of hyperthyroid. Pt wishes to continue at current dose.  Continue levothyroxine 88 mcg daily      Other Visit Diagnoses       Routine general medical examination at a health care facility    -  Primary     Colon cancer screening       Relevant Orders   Fecal occult blood, imunochemical      Kerby Nora, MD

## 2023-11-02 NOTE — Assessment & Plan Note (Addendum)
 Chronic well-controlled in past, above goal in office today... pt will follow at home... call with measurement in 1-2 weeks.  Continue lisinopril 10 mg p.o. daily.

## 2023-11-07 ENCOUNTER — Other Ambulatory Visit: Payer: Self-pay

## 2023-11-07 DIAGNOSIS — Z1211 Encounter for screening for malignant neoplasm of colon: Secondary | ICD-10-CM

## 2023-11-08 ENCOUNTER — Encounter: Payer: Self-pay | Admitting: *Deleted

## 2023-11-09 ENCOUNTER — Encounter: Payer: Self-pay | Admitting: Family Medicine

## 2023-11-09 LAB — FECAL OCCULT BLOOD, IMMUNOCHEMICAL: Fecal Occult Bld: NEGATIVE

## 2023-11-24 ENCOUNTER — Other Ambulatory Visit: Payer: Self-pay | Admitting: Family Medicine

## 2023-12-20 ENCOUNTER — Other Ambulatory Visit: Payer: Self-pay | Admitting: Family Medicine

## 2023-12-20 DIAGNOSIS — E119 Type 2 diabetes mellitus without complications: Secondary | ICD-10-CM

## 2023-12-20 DIAGNOSIS — E782 Mixed hyperlipidemia: Secondary | ICD-10-CM

## 2023-12-21 ENCOUNTER — Other Ambulatory Visit: Payer: Self-pay | Admitting: Family Medicine

## 2023-12-21 DIAGNOSIS — E119 Type 2 diabetes mellitus without complications: Secondary | ICD-10-CM

## 2024-01-05 ENCOUNTER — Encounter: Payer: Self-pay | Admitting: Pharmacist

## 2024-01-05 NOTE — Progress Notes (Signed)
 Pharmacy Quality Measure Review  This patient is appearing on a report for being at risk of failing the adherence measure for cholesterol (statin) medications this calendar year.   Medication: rosuvastatin  20 mg  Looks like [facility] had been billing day supply significantly wrong.    Medication has been refilled (with appropriate day supply billed) as of 12/20/2023 for 90 day supply.  3 additional refills remaining.   No further action needed at this time.

## 2024-01-18 ENCOUNTER — Other Ambulatory Visit: Payer: Self-pay | Admitting: *Deleted

## 2024-01-18 DIAGNOSIS — E119 Type 2 diabetes mellitus without complications: Secondary | ICD-10-CM

## 2024-01-18 MED ORDER — ONETOUCH ULTRA BLUE TEST VI STRP: ORAL_STRIP | 3 refills | Status: DC

## 2024-01-18 NOTE — Telephone Encounter (Signed)
 Copied from CRM [id number]. Topic: Clinical - Medication Question >> Jan 18, 2024  9:47 AM Andrea Browning wrote: Reason for CRM: Pt state she Is needing the one touch ultra blue test strips sent to [id number] [place], [place] - [address] as she does not have anymore test strips.

## 2024-01-18 NOTE — Telephone Encounter (Signed)
 Rx for test strips sent to [facility] as requested.

## 2024-03-25 ENCOUNTER — Encounter: Payer: Self-pay | Admitting: Pharmacist

## 2024-03-25 NOTE — Progress Notes (Signed)
 Pharmacy Quality Measure Review  This patient is appearing on a report for being at risk of failing the adherence measure for cholesterol (statin) medications this calendar year.   Medication: rosuvastatin  20 mg Last fill date: 12/20/23 for 90 day supply  Insurance report was not up to date. No action needed at this time.  Medication has been refilled as of 03/16/24 x90 day supply.  Sold date confirmed, 03/21/24.  2 additional 90d refills remain.

## 2024-05-24 ENCOUNTER — Other Ambulatory Visit: Payer: Self-pay | Admitting: Family Medicine

## 2024-05-24 NOTE — Telephone Encounter (Signed)
 Please call and schedule diabetes follow up with Dr. Ermalene Searing.

## 2024-05-27 ENCOUNTER — Encounter: Payer: Self-pay | Admitting: Pharmacist

## 2024-05-27 NOTE — Progress Notes (Signed)
 Pharmacy Quality Measure Review  This patient is appearing on a report for being at risk of failing the adherence measure for cholesterol (statin) medications this calendar year.   Medication: rosuvastatin  20 mg Last fill date: 12/20/23 for 90 day supply  Insurance report was not up to date. No action needed at this time.  Medication has been refilled as of 03/16/24 x90ds.  2 additional refills remaining.  Next refill due 06/14/24. Reminder set.

## 2024-06-03 NOTE — Telephone Encounter (Signed)
Called pt no VM.

## 2024-06-14 ENCOUNTER — Encounter: Payer: Self-pay | Admitting: Family Medicine

## 2024-06-14 ENCOUNTER — Ambulatory Visit (INDEPENDENT_AMBULATORY_CARE_PROVIDER_SITE_OTHER): Admitting: Family Medicine

## 2024-06-14 ENCOUNTER — Encounter: Payer: Self-pay | Admitting: Pharmacist

## 2024-06-14 VITALS — BP 112/68 | HR 52 | Temp 97.9°F | Ht 65.75 in | Wt 172.5 lb

## 2024-06-14 DIAGNOSIS — E119 Type 2 diabetes mellitus without complications: Secondary | ICD-10-CM

## 2024-06-14 DIAGNOSIS — E1159 Type 2 diabetes mellitus with other circulatory complications: Secondary | ICD-10-CM

## 2024-06-14 DIAGNOSIS — I152 Hypertension secondary to endocrine disorders: Secondary | ICD-10-CM | POA: Diagnosis not present

## 2024-06-14 DIAGNOSIS — Z7984 Long term (current) use of oral hypoglycemic drugs: Secondary | ICD-10-CM | POA: Diagnosis not present

## 2024-06-14 LAB — POCT GLYCOSYLATED HEMOGLOBIN (HGB A1C): Hemoglobin A1C: 6.5 % — AB (ref 4.0–5.6)

## 2024-06-14 NOTE — Assessment & Plan Note (Addendum)
 Chronic, improved control with lifestyle change.    She will continue metformin  XR 500 mg p.o. daily but will keep an eye out for hypoglycemia.    Up-to-date with foot exam and microalbumin test.

## 2024-06-14 NOTE — Progress Notes (Signed)
 Patient ID: Andrea Browning, female    DOB: [birth year], 73 y.o.   MRN: [id number]  This visit was conducted in person.  BP 112/68   Pulse (!) 52   Temp 97.9 F (36.6 C) (Temporal)   Ht 5' 5.75 (1.67 m)   Wt 172 lb 8 oz (78.2 kg)   SpO2 99%   BMI 28.05 kg/m    CC:  Chief Complaint  Patient presents with   Medical Management of Chronic Issues    Pt here for DM f/u     Subjective:   HPI: Andrea Browning is a 73 y.o. female presenting on 06/14/2024 for Medical Management of Chronic Issues (Pt here for DM f/u)  Diabetes:   Improved control  on metformin  XR 500 mg daily   Lab Results  Component Value Date   HGBA1C 6.5 (A) 06/14/2024  Using medications without difficulties: no SE Hypoglycemic episodes: none Hyperglycemic episodes: none Feet problems: no ulcer Blood Sugars averaging:    FBS 109-113 eye exam within last year:  yes  Hypertension:  Above goal in office today on lisinopril  10 mg daily BP Readings from Last 3 Encounters:  06/14/24 112/68  11/02/23 (!) 156/80  05/02/23 120/70  Using medication without problems or lightheadedness:  none Chest pain with exertion:none Edema: none Short of breath: none Average home BPs: not checking Other issues:    Relevant past medical, surgical, family and social history reviewed and updated as indicated. Interim medical history since our last visit reviewed. Allergies and medications reviewed and updated. Outpatient Medications Prior to Visit  Medication Sig Dispense Refill   betamethasone  dipropionate 0.05 % cream Apply 1 application  topically 2 (two) times daily as needed.     Blood Glucose Monitoring Suppl (ONE TOUCH ULTRA 2) w/Device KIT SMARTSIG:Via Meter     Cholecalciferol (VITAMIN D3) 25 MCG (1000 UT) capsule Take 1,000 Units by mouth daily.     glucose blood (ONETOUCH ULTRA BLUE TEST) test strip Use to check blood sugar daily 100 each 3   Lancets (ONETOUCH DELICA PLUS LANCET33G) MISC Apply topically 3  (three) times daily.     levothyroxine  (SYNTHROID ) 88 MCG tablet TAKE 1 TAB BY MOUTH 1XDAY ON AN EMPTY STOMACH W/GLASS OF WATER AT LEAST 30-60 MINS BEFORE BREAKFAST 90 tablet 3   lisinopril  (ZESTRIL ) 10 MG tablet TAKE 1 TABLET BY MOUTH EVERY DAY 90 tablet 1   metFORMIN  (GLUCOPHAGE -XR) 500 MG 24 hr tablet TAKE 1 TABLET BY MOUTH EVERY DAY WITH DINNER 90 tablet 1   rosuvastatin  (CRESTOR ) 20 MG tablet Take 1 tablet (20 mg total) by mouth daily. 90 tablet 3   No facility-administered medications prior to visit.     Per HPI unless specifically indicated in ROS section below Review of Systems  Constitutional:  Negative for fatigue and fever.  HENT:  Negative for congestion.   Eyes:  Negative for pain.  Respiratory:  Negative for cough and shortness of breath.   Cardiovascular:  Negative for chest pain, palpitations and leg swelling.  Gastrointestinal:  Negative for abdominal pain.  Genitourinary:  Negative for dysuria and vaginal bleeding.  Musculoskeletal:  Negative for back pain.  Neurological:  Negative for syncope, light-headedness and headaches.  Psychiatric/Behavioral:  Negative for dysphoric mood.    Objective:  BP 112/68   Pulse (!) 52   Temp 97.9 F (36.6 C) (Temporal)   Ht 5' 5.75 (1.67 m)   Wt 172 lb 8 oz (78.2 kg)   SpO2 99%  BMI 28.05 kg/m   Wt Readings from Last 3 Encounters:  06/14/24 172 lb 8 oz (78.2 kg)  11/02/23 177 lb 2 oz (80.3 kg)  10/25/23 178 lb (80.7 kg)      Physical Exam Vitals and nursing note reviewed.  Constitutional:      General: She is not in acute distress.    Appearance: Normal appearance. She is well-developed. She is not ill-appearing or toxic-appearing.  HENT:     Head: Normocephalic.     Right Ear: Hearing, tympanic membrane, ear canal and external ear normal.     Left Ear: Hearing, tympanic membrane, ear canal and external ear normal.     Nose: Nose normal.  Eyes:     General: Lids are normal. Lids are everted, no foreign bodies  appreciated.     Conjunctiva/sclera: Conjunctivae normal.     Pupils: Pupils are equal, round, and reactive to light.  Neck:     Thyroid : No thyroid  mass or thyromegaly.     Vascular: No carotid bruit.     Trachea: Trachea normal.  Cardiovascular:     Rate and Rhythm: Normal rate and regular rhythm.     Heart sounds: Normal heart sounds, S1 normal and S2 normal. No murmur heard.    No gallop.  Pulmonary:     Effort: Pulmonary effort is normal. No respiratory distress.     Breath sounds: Normal breath sounds. No wheezing, rhonchi or rales.  Abdominal:     General: Bowel sounds are normal. There is no distension or abdominal bruit.     Palpations: Abdomen is soft. There is no fluid wave or mass.     Tenderness: There is no abdominal tenderness. There is no guarding or rebound.     Hernia: No hernia is present.  Musculoskeletal:     Cervical back: Normal range of motion and neck supple.  Lymphadenopathy:     Cervical: No cervical adenopathy.  Skin:    General: Skin is warm and dry.     Findings: No rash.  Neurological:     Mental Status: She is alert.     Cranial Nerves: No cranial nerve deficit.     Sensory: No sensory deficit.  Psychiatric:        Mood and Affect: Mood is not anxious or depressed.        Speech: Speech normal.        Behavior: Behavior normal. Behavior is cooperative.        Judgment: Judgment normal.      Results for orders placed or performed in visit on 06/14/24  POCT glycosylated hemoglobin (Hb A1C)   Collection Time: 06/14/24 11:02 AM  Result Value Ref Range   Hemoglobin A1C 6.5 (A) 4.0 - 5.6 %   HbA1c POC (<> result, manual entry)     HbA1c, POC (prediabetic range)     HbA1c, POC (controlled diabetic range)      This visit occurred during the SARS-CoV-2 public health emergency.  Safety protocols were in place, including screening questions prior to the visit, additional usage of staff PPE, and extensive cleaning of exam room while observing  appropriate contact time as indicated for disinfecting solutions.   COVID 19 screen:  No recent travel or known exposure to COVID19 The patient denies respiratory symptoms of COVID 19 at this time. The importance of social distancing was discussed today.   Assessment and Plan  Problem List Items Addressed This Visit     Controlled type 2 diabetes mellitus  without complication, without long-term current use of insulin (HCC) - Primary (Chronic)   Chronic, improved control with lifestyle change.    Trying to get back to walking but right knee pain occ limiting her.   She will continue metformin  XR 500 mg p.o. daily but will keep an eye out for hypoglycemia.    Up-to-date with foot exam and microalbumin test.      Relevant Orders   POCT glycosylated hemoglobin (Hb A1C) (Completed)   Hypertension associated with diabetes (HCC) (Chronic)   Chronic well-controlled   Continue lisinopril  10 mg p.o. daily.       Greig Ring, MD

## 2024-06-14 NOTE — Assessment & Plan Note (Signed)
Chronic well-controlled Continue lisinopril 10 mg p.o. daily.

## 2024-06-14 NOTE — Progress Notes (Signed)
 Pharmacy Quality Measure Review  This patient is appearing on a report for being at risk of failing the adherence measure for cholesterol (statin) medications this calendar year.   Medication: rosuvastatin  20 mg Last fill date: 03/16/24 for 90 day supply  Contacted pharmacy to facilitate refills. Enrolled in auto-refill, set to fill on 06/16/24 x90 ds.

## 2024-06-15 ENCOUNTER — Other Ambulatory Visit: Payer: Self-pay | Admitting: Family Medicine

## 2024-06-15 DIAGNOSIS — E119 Type 2 diabetes mellitus without complications: Secondary | ICD-10-CM

## 2024-06-17 ENCOUNTER — Encounter: Payer: Self-pay | Admitting: Pharmacist

## 2024-06-25 ENCOUNTER — Encounter: Payer: Self-pay | Admitting: Pharmacist

## 2024-06-25 NOTE — Progress Notes (Signed)
 Pharmacy Quality Measure Review  This patient is appearing on a report for being at risk of failing the adherence measure for cholesterol (statin) medications this calendar year.   Medication: rosuvastatin  20 mg Last fill date: 03/16/24 for 90 day supply  Insurance report was not up to date. No action needed at this time.  Medication has been refilled as of 06/16/24.  Next refill 2026

## 2024-06-28 ENCOUNTER — Encounter: Payer: Self-pay | Admitting: Family Medicine

## 2024-06-28 ENCOUNTER — Ambulatory Visit: Admitting: Family Medicine

## 2024-06-28 VITALS — BP 140/70 | HR 56 | Temp 98.4°F | Ht 65.75 in | Wt 172.5 lb

## 2024-06-28 DIAGNOSIS — M25511 Pain in right shoulder: Secondary | ICD-10-CM

## 2024-06-28 DIAGNOSIS — Z8261 Family history of arthritis: Secondary | ICD-10-CM

## 2024-06-28 DIAGNOSIS — I152 Hypertension secondary to endocrine disorders: Secondary | ICD-10-CM

## 2024-06-28 MED ORDER — DICLOFENAC SODIUM 75 MG PO TBEC
75.0000 mg | DELAYED_RELEASE_TABLET | Freq: Two times a day (BID) | ORAL | 0 refills | Status: AC
Start: 2024-06-28 — End: ?

## 2024-06-28 NOTE — Progress Notes (Signed)
 Patient ID: Andrea Browning, female    DOB: 03-23-1951, 73 y.o.   MRN: [id number]  This visit was conducted in person.  BP (!) 140/70   Pulse (!) 56   Temp 98.4 F (36.9 C) (Temporal)   Ht 5' 5.75 (1.67 m)   Wt 172 lb 8 oz (78.2 kg)   SpO2 97%   BMI 28.05 kg/m    CC:  Chief Complaint  Patient presents with   Shoulder Pain    Bilateral-Makes her feel muscle weakness in her arms   Hypertension    Subjective:   HPI: Andrea Browning is a 73 y.o. female presenting on 06/28/2024 for Shoulder Pain (Bilateral-Makes her feel muscle weakness in her arms) and Hypertension  Hypertension:   Recent inadequate control..  running 167/65 BP Readings from Last 3 Encounters:  06/28/24 (!) 140/70  06/14/24 112/68  11/02/23 (!) 156/80  Using medication without problems or lightheadedness:  none Chest pain with exertion: none Edema:none Short of breath:none Average home BPs: Other issues:  New onset bilateral shoulder ache .. Mainly at night... new in slast 3 weeks.  Muscles in upper arms are weak and sore.  Today feeling better... significantly worse at night.  No neck pain. Occ right 3 fingers are stiff no numbness.  Feels arms are weak..   She tried some diclofenac  of her husband.. pain is improved today.   No redness and swelling in any joints.   No salt caffeine, no ETOH,  limited salt   In last 2 weeks glucose have been increased.   Grandmother with RA.  No past skin issues like psoriasis, lupus.      Relevant past medical, surgical, family and social history reviewed and updated as indicated. Interim medical history since our last visit reviewed. Allergies and medications reviewed and updated. Outpatient Medications Prior to Visit  Medication Sig Dispense Refill   betamethasone  dipropionate 0.05 % cream Apply 1 application  topically 2 (two) times daily as needed.     Blood Glucose Monitoring Suppl (ONE TOUCH ULTRA 2) w/Device KIT SMARTSIG:Via Meter      Cholecalciferol (VITAMIN D3) 25 MCG (1000 UT) capsule Take 1,000 Units by mouth daily.     glucose blood (ONETOUCH ULTRA BLUE TEST) test strip Use to check blood sugar daily 100 each 3   Lancets (ONETOUCH DELICA PLUS LANCET33G) MISC Apply topically 3 (three) times daily.     levothyroxine  (SYNTHROID ) 88 MCG tablet TAKE 1 TAB BY MOUTH 1XDAY ON AN EMPTY STOMACH W/GLASS OF WATER AT LEAST 30-60 MINS BEFORE BREAKFAST 90 tablet 3   lisinopril  (ZESTRIL ) 10 MG tablet TAKE 1 TABLET BY MOUTH EVERY DAY 90 tablet 1   metFORMIN  (GLUCOPHAGE -XR) 500 MG 24 hr tablet TAKE 1 TABLET BY MOUTH EVERY DAY WITH DINNER 90 tablet 1   rosuvastatin  (CRESTOR ) 20 MG tablet Take 1 tablet (20 mg total) by mouth daily. 90 tablet 3   No facility-administered medications prior to visit.     Per HPI unless specifically indicated in ROS section below Review of Systems Objective:  BP (!) 140/70   Pulse (!) 56   Temp 98.4 F (36.9 C) (Temporal)   Ht 5' 5.75 (1.67 m)   Wt 172 lb 8 oz (78.2 kg)   SpO2 97%   BMI 28.05 kg/m   Wt Readings from Last 3 Encounters:  06/28/24 172 lb 8 oz (78.2 kg)  06/14/24 172 lb 8 oz (78.2 kg)  11/02/23 177 lb 2 oz (80.3 kg)  Physical Exam Constitutional:      General: She is not in acute distress.    Appearance: Normal appearance. She is well-developed. She is not ill-appearing or toxic-appearing.  HENT:     Head: Normocephalic.     Right Ear: Hearing, tympanic membrane, ear canal and external ear normal. Tympanic membrane is not erythematous, retracted or bulging.     Left Ear: Hearing, tympanic membrane, ear canal and external ear normal. Tympanic membrane is not erythematous, retracted or bulging.     Nose: No mucosal edema or rhinorrhea.     Right Sinus: No maxillary sinus tenderness or frontal sinus tenderness.     Left Sinus: No maxillary sinus tenderness or frontal sinus tenderness.     Mouth/Throat:     Pharynx: Uvula midline.  Eyes:     General: Lids are normal. Lids are  everted, no foreign bodies appreciated.     Conjunctiva/sclera: Conjunctivae normal.     Pupils: Pupils are equal, round, and reactive to light.  Neck:     Thyroid : No thyroid  mass or thyromegaly.     Vascular: No carotid bruit.     Trachea: Trachea normal.  Cardiovascular:     Rate and Rhythm: Normal rate and regular rhythm.     Pulses: Normal pulses.     Heart sounds: Normal heart sounds, S1 normal and S2 normal. No murmur heard.    No friction rub. No gallop.  Pulmonary:     Effort: Pulmonary effort is normal. No tachypnea or respiratory distress.     Breath sounds: Normal breath sounds. No decreased breath sounds, wheezing, rhonchi or rales.  Abdominal:     General: Bowel sounds are normal.     Palpations: Abdomen is soft.     Tenderness: There is no abdominal tenderness.  Musculoskeletal:     Right shoulder: Tenderness present. No swelling, deformity, effusion, bony tenderness or crepitus. Decreased range of motion.     Left shoulder: Tenderness present. No swelling, deformity, effusion, bony tenderness or crepitus. Decreased range of motion.     Cervical back: Normal range of motion and neck supple.     Comments: Negative spurlings bialterally  Skin:    General: Skin is warm and dry.     Findings: No rash.  Neurological:     Mental Status: She is alert.  Psychiatric:        Mood and Affect: Mood is not anxious or depressed.        Speech: Speech normal.        Behavior: Behavior normal. Behavior is cooperative.        Thought Content: Thought content normal.        Judgment: Judgment normal.       Results for orders placed or performed in visit on 06/14/24  POCT glycosylated hemoglobin (Hb A1C)   Collection Time: 06/14/24 11:02 AM  Result Value Ref Range   Hemoglobin A1C 6.5 (A) 4.0 - 5.6 %   HbA1c POC (<> result, manual entry)     HbA1c, POC (prediabetic range)     HbA1c, POC (controlled diabetic range)      Assessment and Plan  Hypertension associated with  diabetes (HCC) Assessment & Plan: Chronic inadequate control but possibly due to pain and shoulder issue.   Will follow over time.   Lisinopril  10 mg p.o. daily.    Family history of rheumatoid arthritis  Acute pain of both shoulders Assessment & Plan: Acute, possible cervical spine origin versus bilateral shoulder issue. Recommend  home physical therapy of shoulders.  Prescribed diclofenac  75 mg p.o. twice daily.  If pain not complaining to improve consider imaging as well as testing for rheumatoid arthritis given grandmother's history.  Return and ER precautions provided.   Other orders -     Diclofenac  Sodium; Take 1 tablet (75 mg total) by mouth 2 (two) times daily.  Dispense: 30 tablet; Refill: 0    No follow-ups on file.   Greig Ring, MD

## 2024-06-28 NOTE — Assessment & Plan Note (Addendum)
 Chronic inadequate control but possibly due to pain and shoulder issue.   Will follow over time.   Lisinopril  10 mg p.o. daily.

## 2024-07-27 DIAGNOSIS — Z8261 Family history of arthritis: Secondary | ICD-10-CM | POA: Insufficient documentation

## 2024-07-27 DIAGNOSIS — M25511 Pain in right shoulder: Secondary | ICD-10-CM | POA: Insufficient documentation

## 2024-07-27 NOTE — Assessment & Plan Note (Signed)
 Acute, possible cervical spine origin versus bilateral shoulder issue. Recommend home physical therapy of shoulders.  Prescribed diclofenac  75 mg p.o. twice daily.  If pain not complaining to improve consider imaging as well as testing for rheumatoid arthritis given grandmother's history.  Return and ER precautions provided.

## 2024-08-21 ENCOUNTER — Other Ambulatory Visit: Payer: Self-pay | Admitting: Family Medicine

## 2024-08-21 DIAGNOSIS — E119 Type 2 diabetes mellitus without complications: Secondary | ICD-10-CM

## 2024-08-21 MED ORDER — ACCU-CHEK SOFTCLIX LANCETS MISC: 3 refills | Status: AC

## 2024-08-21 MED ORDER — ACCU-CHEK GUIDE CONTROL VI LIQD: 3 refills | Status: AC

## 2024-08-21 MED ORDER — ACCU-CHEK GUIDE W/DEVICE KIT: PACK | 0 refills | Status: AC

## 2024-08-21 MED ORDER — ACCU-CHEK GUIDE TEST VI STRP: ORAL_STRIP | 3 refills | Status: AC

## 2024-10-25 ENCOUNTER — Ambulatory Visit

## 2024-12-05 ENCOUNTER — Other Ambulatory Visit

## 2024-12-12 ENCOUNTER — Encounter: Admitting: Family Medicine
